# Patient Record
Sex: Female | Born: 1980 | Race: White | Hispanic: No | Marital: Married | Smoking: Never smoker
Health system: Southern US, Community
[De-identification: ages and names within clinical notes are randomized; demographics above are authoritative.]

## PROBLEM LIST (undated history)

## (undated) DIAGNOSIS — J45909 Unspecified asthma, uncomplicated: Secondary | ICD-10-CM

## (undated) DIAGNOSIS — E079 Disorder of thyroid, unspecified: Secondary | ICD-10-CM

## (undated) DIAGNOSIS — T7840XA Allergy, unspecified, initial encounter: Secondary | ICD-10-CM

## (undated) DIAGNOSIS — F419 Anxiety disorder, unspecified: Secondary | ICD-10-CM

## (undated) HISTORY — PX: BREAST ENHANCEMENT SURGERY: SHX7

## (undated) HISTORY — PX: CHOLECYSTECTOMY: SHX55

## (undated) HISTORY — DX: Allergy, unspecified, initial encounter: T78.40XA

## (undated) HISTORY — DX: Disorder of thyroid, unspecified: E07.9

## (undated) HISTORY — PX: WISDOM TOOTH EXTRACTION: SHX21

## (undated) HISTORY — DX: Anxiety disorder, unspecified: F41.9

---

## 2007-07-21 DIAGNOSIS — J45909 Unspecified asthma, uncomplicated: Secondary | ICD-10-CM | POA: Insufficient documentation

## 2007-08-11 ENCOUNTER — Ambulatory Visit: Payer: Self-pay | Admitting: Family Medicine

## 2007-09-22 ENCOUNTER — Ambulatory Visit: Payer: Self-pay | Admitting: Surgery

## 2007-10-03 ENCOUNTER — Ambulatory Visit: Payer: Self-pay | Admitting: Surgery

## 2014-09-12 ENCOUNTER — Emergency Department: Admit: 2014-09-12 | Disposition: A | Discharge status: Home / Self Care | Payer: Self-pay | Admitting: Student

## 2014-09-13 LAB — RESULTS CONSOLE HPV: CHL HPV: NEGATIVE

## 2014-09-13 LAB — HM PAP SMEAR: HM Pap smear: NEGATIVE

## 2016-10-01 DIAGNOSIS — J4530 Mild persistent asthma, uncomplicated: Secondary | ICD-10-CM | POA: Insufficient documentation

## 2017-01-07 ENCOUNTER — Encounter: Payer: Self-pay | Admitting: Maternal Newborn

## 2017-01-07 ENCOUNTER — Ambulatory Visit (INDEPENDENT_AMBULATORY_CARE_PROVIDER_SITE_OTHER): Payer: Self-pay | Admitting: Maternal Newborn

## 2017-01-07 VITALS — BP 120/80 | HR 84 | Ht 65.0 in | Wt 166.0 lb

## 2017-01-07 DIAGNOSIS — R300 Dysuria: Secondary | ICD-10-CM

## 2017-01-07 DIAGNOSIS — N390 Urinary tract infection, site not specified: Secondary | ICD-10-CM

## 2017-01-07 LAB — POCT URINALYSIS DIPSTICK
BILIRUBIN UA: NEGATIVE
Blood, UA: NEGATIVE
GLUCOSE UA: NEGATIVE
Ketones, UA: NEGATIVE
NITRITE UA: NEGATIVE
PH UA: 5.5 (ref 5.0–8.0)
Protein, UA: NEGATIVE
Spec Grav, UA: 1.02 (ref 1.010–1.025)
Urobilinogen, UA: 2 E.U./dL — AB

## 2017-01-07 MED ORDER — SULFAMETHOXAZOLE-TRIMETHOPRIM 800-160 MG PO TABS: 1.0000 | ORAL_TABLET | Freq: Two times a day (BID) | ORAL | 1 refills | Status: DC

## 2017-01-07 NOTE — Addendum Note (Signed)
Addended by: Marcelyn Bruins on: 01/07/2017 04:08 PM   Modules accepted: Orders

## 2017-01-07 NOTE — Patient Instructions (Signed)

## 2017-01-07 NOTE — Progress Notes (Signed)
     Obstetrics & Gynecology Office Visit   Chief Complaint: Dysuria, frequent urination Chief Complaint  Patient presents with  . Urinary Tract Infection    History of Present Illness: Ms. KENDALYNN KANAAN is a 36 y.o. [phone] who LMP was No LMP recorded., presents today for a problem visit.  She complains of dysuria, frequency and incomplete bladder emptying . She has had symptoms for 5 days. Patient also complains of urinary urgency. Symptoms are moderate.  Patient denies fever and chills, and flank pain. Patient does not have a history of recurrent UTI,  does not have a history of pyelonephritis, does not have a history of nephrolithiasis.  She has not had previous treatment for her current symptoms.    Review of Systems: Genito-Urinary ROS: positive for - change in urinary stream, dysuria and urinary frequency/urgency Negative for hematuria.  All other systems negative.  Past Medical History:  History reviewed. No pertinent past medical history.  Past Surgical History:  History reviewed. No pertinent surgical history.  Gynecologic History: No LMP recorded.  Obstetric History: [id number]  Family History:  Family History  Problem Relation Age of Onset  . Diabetes Mother   . Hypertension Father     Social History:  Social History   Social History  . Marital status: Married    Spouse name: N/A  . Number of children: N/A  . Years of education: N/A   Occupational History  . Not on file.   Social History Main Topics  . Smoking status: Never Smoker  . Smokeless tobacco: Never Used  . Alcohol use Yes  . Drug use: No  . Sexual activity: Yes    Birth control/ protection: IUD   Other Topics Concern  . Not on file   Social History Narrative  . No narrative on file    Allergies:  Allergies  Allergen Reactions  . Shellfish-Derived Products Anaphylaxis  . Shellfish Allergy     Medications: Prior to Admission medications   Medication Sig Start Date End Date Taking?  Authorizing Provider  sulfamethoxazole-trimethoprim (BACTRIM DS,SEPTRA DS) 800-160 MG tablet Take 1 tablet by mouth 2 (two) times daily. 01/07/17   Oswaldo Conroy, CNM    Physical Exam Vitals:  Vitals:   01/07/17 1051  BP: 120/80  Pulse: 84   No LMP recorded.  General: NAD Neurologic: Grossly intact Psychiatric: mood appropriate, affect full   Assessment: 36 y.o. G2P2002 presents with urinary frequency, urgency, and dysuria. Denies fever, chills, flank pain. UA positive for leukocytes.  Plan: Problem List Items Addressed This Visit    None    Visit Diagnoses    Dysuria    -  Primary   Relevant Medications   sulfamethoxazole-trimethoprim (BACTRIM DS,SEPTRA DS) 800-160 MG tablet   Other Relevant Orders   POCT urinalysis dipstick (Completed)     Patient to take Bactrim for seven day course. Urine cultures sent and will advise patient if results necessitate a change in antibiotic therapy.  Patient asked to return to clinic if symptoms fail to improve with abx or worsen.  Marcelyn Bruins, CNM

## 2017-01-09 LAB — URINE CULTURE: ORGANISM ID, BACTERIA: NO GROWTH

## 2018-04-16 ENCOUNTER — Encounter: Payer: Self-pay | Admitting: Emergency Medicine

## 2018-04-16 ENCOUNTER — Emergency Department: Payer: Self-pay

## 2018-04-16 ENCOUNTER — Other Ambulatory Visit: Payer: Self-pay

## 2018-04-16 ENCOUNTER — Emergency Department
Admission: EM | Admit: 2018-04-16 | Discharge: 2018-04-16 | Disposition: A | Discharge status: Home / Self Care | Payer: Self-pay | Attending: Student in an Organized Health Care Education/Training Program | Admitting: Student in an Organized Health Care Education/Training Program

## 2018-04-16 DIAGNOSIS — Y92410 Unspecified street and highway as the place of occurrence of the external cause: Secondary | ICD-10-CM | POA: Insufficient documentation

## 2018-04-16 DIAGNOSIS — Y999 Unspecified external cause status: Secondary | ICD-10-CM | POA: Insufficient documentation

## 2018-04-16 DIAGNOSIS — Y9389 Activity, other specified: Secondary | ICD-10-CM | POA: Insufficient documentation

## 2018-04-16 DIAGNOSIS — M5489 Other dorsalgia: Secondary | ICD-10-CM | POA: Insufficient documentation

## 2018-04-16 DIAGNOSIS — J45909 Unspecified asthma, uncomplicated: Secondary | ICD-10-CM | POA: Insufficient documentation

## 2018-04-16 DIAGNOSIS — Z79899 Other long term (current) drug therapy: Secondary | ICD-10-CM | POA: Insufficient documentation

## 2018-04-16 DIAGNOSIS — R079 Chest pain, unspecified: Secondary | ICD-10-CM | POA: Insufficient documentation

## 2018-04-16 HISTORY — DX: Unspecified asthma, uncomplicated: J45.909

## 2018-04-16 LAB — TROPONIN I: Troponin I: <0.03 ng/mL (ref <0.03)

## 2018-04-16 LAB — POCT PREGNANCY, URINE: Preg Test, Ur: NEGATIVE

## 2018-04-16 MED ORDER — IBUPROFEN 600 MG PO TABS: 600.0000 mg | ORAL_TABLET | Freq: Four times a day (QID) | ORAL | 0 refills | Status: DC | PRN

## 2018-04-16 MED ORDER — CYCLOBENZAPRINE HCL 5 MG PO TABS: ORAL_TABLET | ORAL | 0 refills | Status: DC
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
## 2018-05-17 HISTORY — PX: ROTATOR CUFF REPAIR: SHX139

## 2018-06-09 ENCOUNTER — Other Ambulatory Visit: Payer: Self-pay

## 2018-06-09 ENCOUNTER — Other Ambulatory Visit: Payer: Self-pay | Admitting: Unknown Physician Specialty

## 2018-06-14 ENCOUNTER — Other Ambulatory Visit (HOSPITAL_COMMUNITY): Payer: Self-pay | Admitting: Unknown Physician Specialty

## 2018-06-14 ENCOUNTER — Other Ambulatory Visit: Payer: Self-pay | Admitting: Unknown Physician Specialty

## 2018-06-14 DIAGNOSIS — G8929 Other chronic pain: Secondary | ICD-10-CM

## 2018-06-14 DIAGNOSIS — S8001XA Contusion of right knee, initial encounter: Secondary | ICD-10-CM

## 2018-06-14 DIAGNOSIS — M25512 Pain in left shoulder: Principal | ICD-10-CM

## 2019-05-30 ENCOUNTER — Other Ambulatory Visit: Payer: Self-pay

## 2019-05-30 ENCOUNTER — Encounter: Payer: Self-pay | Admitting: Obstetrics and Gynecology

## 2019-05-30 ENCOUNTER — Ambulatory Visit (INDEPENDENT_AMBULATORY_CARE_PROVIDER_SITE_OTHER): Payer: Managed Care, Other (non HMO) | Admitting: Obstetrics and Gynecology

## 2019-05-30 VITALS — BP 128/77 | HR 94 | Ht 64.0 in | Wt 162.6 lb

## 2019-05-30 DIAGNOSIS — Z30432 Encounter for removal of intrauterine contraceptive device: Secondary | ICD-10-CM

## 2019-05-30 NOTE — Progress Notes (Signed)
     GYNECOLOGY OFFICE PROCEDURE NOTE  Dawn Mejia is a 39 y.o. [phone] here for Mirena IUD removal. No GYN concerns. Notes that she and her partner are considering conception. IUD is due for removal this year, was insertedin 2016.   IUD Removal  Patient identified, informed consent performed, consent signed.  Patient was in the dorsal lithotomy position, normal external genitalia was noted.  A speculum was placed in the patient's vagina, normal discharge was noted, no lesions. The cervix was visualized, no lesions, no abnormal discharge.  The strings of the IUD were grasped and pulled using ring forceps. The IUD was removed in its entirety. Patient tolerated the procedure well.    Patient plans for pregnancy soon and she was told to avoid teratogens, take PNV and folic acid.  Routine preventative health maintenance measures emphasized.   Hildred Laser, MD [facility]

## 2019-05-30 NOTE — Patient Instructions (Signed)
Preparing for Pregnancy °If you are considering becoming pregnant, make an appointment to see your regular health care provider to learn how to prepare for a safe and healthy pregnancy (preconception care). During a preconception care visit, your health care provider will: °· Do a complete physical exam, including a Pap test. °· Take a complete medical history. °· Give you information, answer your questions, and help you resolve problems. °Preconception checklist °Medical history °· Tell your health care provider about any current or past medical conditions. Your pregnancy or your ability to become pregnant may be affected by chronic conditions, such as diabetes, chronic hypertension, and thyroid problems. °· Include your family's medical history as well as your partner's medical history. °· Tell your health care provider about any history of STIs (sexually transmitted infections). These can affect your pregnancy. In some cases, they can be passed to your baby. Discuss any concerns that you have about STIs. °· If indicated, discuss the benefits of genetic testing. This testing will show whether there are any genetic conditions that may be passed from you or your partner to your baby. °· Tell your health care provider about: °? Any problems you have had with conception or pregnancy. °? Any medicines you take. These include vitamins, herbal supplements, and over-the-counter medicines. °? Your history of immunizations. Discuss any vaccinations that you may need. °Diet °· Ask your health care provider what to include in a healthy diet that has a balance of nutrients. This is especially important when you are pregnant or preparing to become pregnant. °· Ask your health care provider to help you reach a healthy weight before pregnancy. °? If you are overweight, you may be at higher risk for certain complications, such as high blood pressure, diabetes, and preterm birth. °? If you are underweight, you are more likely to  have a baby who has a low birth weight. °Lifestyle, work, and home °· Let your health care provider know: °? About any lifestyle habits that you have, such as alcohol use, drug use, or smoking. °? About recreational activities that may put you at risk during pregnancy, such as downhill skiing and certain exercise programs. °? Tell your health care provider about any international travel, especially any travel to places with an active Zika virus outbreak. °? About harmful substances that you may be exposed to at work or at home. These include chemicals, pesticides, radiation, or even litter boxes. °? If you do not feel safe at home. °Mental health °· Tell your health care provider about: °? Any history of mental health conditions, including feelings of depression, sadness, or anxiety. °? Any medicines that you take for a mental health condition. These include herbs and supplements. °Home instructions to prepare for pregnancy °Lifestyle ° °· Eat a balanced diet. This includes fresh fruits and vegetables, whole grains, lean meats, low-fat dairy products, healthy fats, and foods that are high in fiber. Ask to meet with a nutritionist or registered dietitian for assistance with meal planning and goals. °· Get regular exercise. Try to be active for at least 30 minutes a day on most days of the week. Ask your health care provider which activities are safe during pregnancy. °· Do not use any products that contain nicotine or tobacco, such as cigarettes and e-cigarettes. If you need help quitting, ask your health care provider. °· Do not drink alcohol. °· Do not take illegal drugs. °· Maintain a healthy weight. Ask your health care provider what weight range is right for you. °General   instructions °· Keep an accurate record of your menstrual periods. This makes it easier for your health care provider to determine your baby's due date. °· Begin taking prenatal vitamins and folic acid supplements daily as directed by your  health care provider. °· Manage any chronic conditions, such as high blood pressure and diabetes, as told by your health care provider. This is important. °How do I know that I am pregnant? °You may be pregnant if you have been sexually active and you miss your period. Symptoms of early pregnancy include: °· Mild cramping. °· Very light vaginal bleeding (spotting). °· Feeling unusually tired. °· Nausea and vomiting (morning sickness). °If you have any of these symptoms and you suspect that you might be pregnant, you can take a home pregnancy test. These tests check for a hormone in your urine (human chorionic gonadotropin, or hCG). A woman's body begins to make this hormone during early pregnancy. These tests are very accurate. Wait until at least the first day after you miss your period to take one. If the test shows that you are pregnant (you get a positive result), call your health care provider to make an appointment for prenatal care. °What should I do if I become pregnant? ° °  ° °· Make an appointment with your health care provider as soon as you suspect you are pregnant. °· Do not use any products that contain nicotine, such as cigarettes, chewing tobacco, and e-cigarettes. If you need help quitting, ask your health care provider. °· Do not drink alcoholic beverages. Alcohol is related to a number of birth defects. °· Avoid toxic odors and chemicals. °· You may continue to have sexual intercourse if it does not cause pain or other problems, such as vaginal bleeding. °This information is not intended to replace advice given to you by your health care provider. Make sure you discuss any questions you have with your health care provider. °Document Revised: 05/05/2017 Document Reviewed: 11/23/2015 °Elsevier Patient Education © 2020 [employer] ° °

## 2019-05-30 NOTE — Progress Notes (Signed)
Pt present for IUD removal. Pt stated that she wants to have another child.

## 2019-06-07 ENCOUNTER — Encounter: Payer: Self-pay | Admitting: Family Medicine

## 2019-06-07 NOTE — Progress Notes (Signed)
Patient: Dawn Mejia, Female    DOB: 1980-11-19, 39 y.o.   MRN: [id number] Visit Date: 06/07/2019  Today's Provider: [name], MD   Chief Complaint  Patient presents with  . New Patient (Initial Visit)   Subjective:    Virtual Visit via Telephone Note  I connected with Dawn Mejia on 06/07/19 at  8:00 AM EST by telephone and verified that I am speaking with the correct person using two identifiers.  Location: Patient location: home Provider location: [facility] Persons involved in the visit: patient, provider   I discussed the limitations, risks, security and privacy concerns of performing an evaluation and management service by telephone and the availability of in person appointments. I also discussed with the patient that there may be a patient responsible charge related to this service. The patient expressed understanding and agreed to proceed.    Establish Care LERA GAINES is a 39 y.o. female who presents today for health maintenance and complete physical. She feels well. She reports exercising daily. She reports she is sleeping poorly.  Patient reports worsening insomnia in the last several months. Trouble falling asleep.  Patient reports she takes multiple Advil PM, Benadryl, and Unisom every night to sleep. Patient reports that in the past she has taken Trazodone (prescribed by Dr Tamala Julian at [facility] in the past), and reports good tolerance and symptom control. She has also tried Ambien in the past and noticed that she was doing things in her sleep that she did not remember.  No recent PCP  Asthma: Sees Dr Raul Del at [facility].  Takes albuterol prn.  Next appointment in March. No hospitalizations. No controller  Anxiety comes and goes with the stresses of life.  Her teen daughters test her patience at times.  Some days, notices that hands are shaking.  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  Much better than it was when she with her ex-husband.  Not  taking anything for it.  Allergic rhinitis: Year-round.  Allergic to grass, tress, pollen.  Takes Zyrtec or Claritin.  IUD removed earlier this month.  Considering pregnancy, as her husband does not have any children yet.  She is followed by Dr Marcelline Mates with [facility].  She has well-woman appt in April. -----------------------------------------------------------------   Review of Systems  Constitutional: Negative.   HENT: Negative.   Eyes: Negative.   Respiratory: Negative.   Cardiovascular: Negative.   Gastrointestinal: Negative.   Endocrine: Negative.   Genitourinary: Negative.   Musculoskeletal: Negative.   Skin: Negative.   Allergic/Immunologic: Positive for environmental allergies.  Neurological: Negative.   Hematological: Negative.   Psychiatric/Behavioral: Positive for sleep disturbance.    Social History      She  reports that she has never smoked. She has never used smokeless tobacco. She reports current alcohol use of about 2.0 standard drinks of alcohol per week. She reports that she does not use drugs.       Social History   Socioeconomic History  . Marital status: Married    Spouse name: Not on file  . Number of children: Not on file  . Years of education: Not on file  . Highest education level: Not on file  Occupational History  . Not on file  Tobacco Use  . Smoking status: Never Smoker  . Smokeless tobacco: Never Used  Substance and Sexual Activity  . Alcohol use: Yes    Alcohol/week: 2.0 standard drinks    Types: 2 Glasses of  wine per week  . Drug use: No  . Sexual activity: Yes  Other Topics Concern  . Not on file  Social History Narrative  . Not on file   Social Determinants of Health   Financial Resource Strain:   . Difficulty of Paying Living Expenses: Not on file  Food Insecurity:   . Worried About Programme researcher, broadcasting/film/video in the Last Year: Not on file  . Ran Out of Food in the Last Year: Not on file  Transportation Needs:   . Lack of  Transportation (Medical): Not on file  . Lack of Transportation (Non-Medical): Not on file  Physical Activity:   . Days of Exercise per Week: Not on file  . Minutes of Exercise per Session: Not on file  Stress:   . Feeling of Stress : Not on file  Social Connections:   . Frequency of Communication with Friends and Family: Not on file  . Frequency of Social Gatherings with Friends and Family: Not on file  . Attends Religious Services: Not on file  . Active Member of Clubs or Organizations: Not on file  . Attends Banker Meetings: Not on file  . Marital Status: Not on file    Past Medical History:  Diagnosis Date  . Allergy   . Anxiety   . Asthma      Patient Active Problem List   Diagnosis Date Noted  . Asthma, stable, mild persistent 10/01/2016    Past Surgical History:  Procedure Laterality Date  . CHOLECYSTECTOMY    . SHOULDER SURGERY    . WISDOM TOOTH EXTRACTION      Family History        Family Status  Relation Name Status  . Mother  Alive  . Father  Deceased  . Sister  Alive  . Daughter  Alive  . Sister  Alive  . Daughter  Alive        Her family history includes Allergies in her sister; Arthritis in her mother; Asthma in her father and sister; COPD in her father; Diabetes in her mother; Hypertension in her father; Thyroid disease in her sister.      Allergies  Allergen Reactions  . Shellfish-Derived Products Anaphylaxis  . Shellfish Allergy   . Tramadol Itching    welts     Current Outpatient Medications:  .  albuterol (PROVENTIL HFA;VENTOLIN HFA) 108 (90 Base) MCG/ACT inhaler, Inhale into the lungs every 6 (six) hours as needed for wheezing or shortness of breath., Disp: , Rfl:  .  diphenhydrAMINE (BENADRYL ALLERGY) 25 mg capsule, Take 25 mg by mouth every 6 (six) hours as needed., Disp: , Rfl:  .  diphenhydrAMINE HCl, Sleep, (UNISOM SLEEPGELS) 50 MG CAPS, Take by mouth., Disp: , Rfl:  .  Ibuprofen-diphenhydrAMINE HCl (ADVIL PM)  200-25 MG CAPS, Take by mouth., Disp: , Rfl:    Patient Care Team: Patient, No Pcp Per as PCP - General (General Practice)    Objective:    Vitals: Ht 5\' 4"  (1.626 m)   Wt 158 lb (71.7 kg)   BMI 27.12 kg/m    Vitals:   06/07/19 1047  Weight: 158 lb (71.7 kg)  Height: 5\' 4"  (1.626 m)     Physical Exam Speaking in full sentences, no apparent distress   Depression Screen PHQ 2/9 Scores 06/07/2019  PHQ - 2 Score 0  PHQ- 9 Score 3       Assessment & Plan:   I discussed the assessment and treatment  plan with the patient. The patient was provided an opportunity to ask questions and all were answered. The patient agreed with the plan and demonstrated an understanding of the instructions.   The patient was advised to call back or seek an in-person evaluation if the symptoms worsen or if the condition fails to improve as anticipated.    Establish care  Exercise Activities and Dietary recommendations Goals   None      There is no immunization history on file for this patient.  Health Maintenance  Topic Date Due  . HIV Screening  12/31/1995  . TETANUS/TDAP  12/31/1999  . PAP SMEAR-Modifier  12/30/2001  . INFLUENZA VACCINE  12/16/2018     Discussed health benefits of physical activity, and encouraged her to engage in regular exercise appropriate for her age and condition.    --------------------------------------------------------------------  Problem List Items Addressed This Visit      Respiratory   Asthma, stable, mild persistent    Chronic and well-controlled No controller medication Continue albuterol as needed Followed by Dr. Meredeth Ide, pulmonology      Allergic rhinitis    Chronic and well-controlled Continue Zyrtec/Claritin as needed        Other   Insomnia - Primary    Chronic and uncontrolled Difficulty falling asleep primarily Tried Ambien in the past and had significant issues with going places in talking to people in doing things that she  did not remember Would avoid this class of medications if possible She has been taking a lot of diphenhydramine to help with sleep initiation We will stop that and start trazodone 50 mg nightly as this has been helpful in the past She will follow-up with me and let me know if she may need a higher dose      Anxiety    Chronic, intermittent, mild Continue stress relief tactics No medication needed at this time Consider CBT in the future      Relevant Medications   traZODone (DESYREL) 50 MG tablet       Return in about 6 months (around 12/06/2019) for CPE.   The entirety of the information documented in the History of Present Illness, Review of Systems and Physical Exam were personally obtained by me. Portions of this information were initially documented by Rondel Baton, CMA and reviewed by me for thoroughness and accuracy.    Maryanne Huneycutt, Marzella Schlein, MD MPH [facility]

## 2019-06-08 ENCOUNTER — Encounter: Payer: Self-pay | Admitting: Family Medicine

## 2019-06-08 ENCOUNTER — Ambulatory Visit (INDEPENDENT_AMBULATORY_CARE_PROVIDER_SITE_OTHER): Payer: Managed Care, Other (non HMO) | Admitting: Family Medicine

## 2019-06-08 VITALS — Ht 64.0 in | Wt 158.0 lb

## 2019-06-08 DIAGNOSIS — J301 Allergic rhinitis due to pollen: Secondary | ICD-10-CM | POA: Diagnosis not present

## 2019-06-08 DIAGNOSIS — J4530 Mild persistent asthma, uncomplicated: Secondary | ICD-10-CM | POA: Diagnosis not present

## 2019-06-08 DIAGNOSIS — G4700 Insomnia, unspecified: Secondary | ICD-10-CM | POA: Diagnosis not present

## 2019-06-08 DIAGNOSIS — F419 Anxiety disorder, unspecified: Secondary | ICD-10-CM | POA: Diagnosis not present

## 2019-06-08 DIAGNOSIS — J309 Allergic rhinitis, unspecified: Secondary | ICD-10-CM | POA: Insufficient documentation

## 2019-06-08 MED ORDER — TRAZODONE HCL 50 MG PO TABS: 50.0000 mg | ORAL_TABLET | Freq: Every evening | ORAL | 3 refills | Status: DC | PRN

## 2019-06-08 NOTE — Patient Instructions (Signed)

## 2019-06-08 NOTE — Assessment & Plan Note (Signed)
Chronic, intermittent, mild Continue stress relief tactics No medication needed at this time Consider CBT in the future

## 2019-06-08 NOTE — Assessment & Plan Note (Signed)
Chronic and uncontrolled Difficulty falling asleep primarily Tried Ambien in the past and had significant issues with going places in talking to people in doing things that she did not remember Would avoid this class of medications if possible She has been taking a lot of diphenhydramine to help with sleep initiation We will stop that and start trazodone 50 mg nightly as this has been helpful in the past She will follow-up with me and let me know if she may need a higher dose

## 2019-06-08 NOTE — Assessment & Plan Note (Signed)
Chronic and well-controlled Continue Zyrtec/Claritin as needed

## 2019-06-08 NOTE — Assessment & Plan Note (Signed)
Chronic and well-controlled No controller medication Continue albuterol as needed Followed by Dr. Meredeth Ide, pulmonology

## 2019-06-16 ENCOUNTER — Encounter: Payer: Self-pay | Admitting: Family Medicine

## 2019-06-18 MED ORDER — TRAZODONE HCL 150 MG PO TABS: 150.0000 mg | ORAL_TABLET | Freq: Every evening | ORAL | 3 refills | Status: DC | PRN

## 2019-06-21 ENCOUNTER — Encounter: Payer: Self-pay | Admitting: Family Medicine

## 2019-06-22 NOTE — Telephone Encounter (Signed)
OK to move up on the schedule.  Needs to be within 2 weeks of discharge Needs 40 min

## 2019-09-11 NOTE — Progress Notes (Signed)
Pt present for annual exam. Pt stated that she was doing well. Pt and his is trying to conceive and have another baby. Pt denies any issues at this time.

## 2019-09-11 NOTE — Patient Instructions (Addendum)
Preventive Care 21-39 Years Old, Female Preventive care refers to visits with your health care provider and lifestyle choices that can promote health and wellness. This includes:  A yearly physical exam. This may also be called an annual well check.  Regular dental visits and eye exams.  Immunizations.  Screening for certain conditions.  Healthy lifestyle choices, such as eating a healthy diet, getting regular exercise, not using drugs or products that contain nicotine and tobacco, and limiting alcohol use. What can I expect for my preventive care visit? Physical exam Your health care provider will check your:  Height and weight. This may be used to calculate body mass index (BMI), which tells if you are at a healthy weight.  Heart rate and blood pressure.  Skin for abnormal spots. Counseling Your health care provider may ask you questions about your:  Alcohol, tobacco, and drug use.  Emotional well-being.  Home and relationship well-being.  Sexual activity.  Eating habits.  Work and work environment.  Method of birth control.  Menstrual cycle.  Pregnancy history. What immunizations do I need?  Influenza (flu) vaccine  This is recommended every year. Tetanus, diphtheria, and pertussis (Tdap) vaccine  You may need a Td booster every 10 years. Varicella (chickenpox) vaccine  You may need this if you have not been vaccinated. Human papillomavirus (HPV) vaccine  If recommended by your health care provider, you may need three doses over 6 months. Measles, mumps, and rubella (MMR) vaccine  You may need at least one dose of MMR. You may also need a second dose. Meningococcal conjugate (MenACWY) vaccine  One dose is recommended if you are age 19-21 years and a first-year college student living in a residence hall, or if you have one of several medical conditions. You may also need additional booster doses. Pneumococcal conjugate (PCV13) vaccine  You may need  this if you have certain conditions and were not previously vaccinated. Pneumococcal polysaccharide (PPSV23) vaccine  You may need one or two doses if you smoke cigarettes or if you have certain conditions. Hepatitis A vaccine  You may need this if you have certain conditions or if you travel or work in places where you may be exposed to hepatitis A. Hepatitis B vaccine  You may need this if you have certain conditions or if you travel or work in places where you may be exposed to hepatitis B. Haemophilus influenzae type b (Hib) vaccine  You may need this if you have certain conditions. You may receive vaccines as individual doses or as more than one vaccine together in one shot (combination vaccines). Talk with your health care provider about the risks and benefits of combination vaccines. What tests do I need?  Blood tests  Lipid and cholesterol levels. These may be checked every 5 years starting at age 20.  Hepatitis C test.  Hepatitis B test. Screening  Diabetes screening. This is done by checking your blood sugar (glucose) after you have not eaten for a while (fasting).  Sexually transmitted disease (STD) testing.  BRCA-related cancer screening. This may be done if you have a family history of breast, ovarian, tubal, or peritoneal cancers.  Pelvic exam and Pap test. This may be done every 3 years starting at age 21. Starting at age 30, this may be done every 5 years if you have a Pap test in combination with an HPV test. Talk with your health care provider about your test results, treatment options, and if necessary, the need for more tests.   Follow these instructions at home: Eating and drinking   Eat a diet that includes fresh fruits and vegetables, whole grains, lean protein, and low-fat dairy.  Take vitamin and mineral supplements as recommended by your health care provider.  Do not drink alcohol if: ? Your health care provider tells you not to drink. ? You are  pregnant, may be pregnant, or are planning to become pregnant.  If you drink alcohol: ? Limit how much you have to 0-1 drink a day. ? Be aware of how much alcohol is in your drink. In the [place] one drink equals one 12 oz bottle of beer (355 mL), one 5 oz glass of wine (148 mL), or one 1 oz glass of hard liquor (44 mL). Lifestyle  Take daily care of your teeth and gums.  Stay active. Exercise for at least 30 minutes on 5 or more days each week.  Do not use any products that contain nicotine or tobacco, such as cigarettes, e-cigarettes, and chewing tobacco. If you need help quitting, ask your health care provider.  If you are sexually active, practice safe sex. Use a condom or other form of birth control (contraception) in order to prevent pregnancy and STIs (sexually transmitted infections). If you plan to become pregnant, see your health care provider for a preconception visit. What's next?  Visit your health care provider once a year for a well check visit.  Ask your health care provider how often you should have your eyes and teeth checked.  Stay up to date on all vaccines. This information is not intended to replace advice given to you by your health care provider. Make sure you discuss any questions you have with your health care provider. Document Revised: 01/12/2018 Document Reviewed: 01/12/2018 Elsevier Patient Education  2020 [employer] Breast Self-Awareness Breast self-awareness is knowing how your breasts look and feel. Doing breast self-awareness is important. It allows you to catch a breast problem early while it is still small and can be treated. All women should do breast self-awareness, including women who have had breast implants. Tell your doctor if you notice a change in your breasts. What you need:  A mirror.  A well-lit room. How to do a breast self-exam A breast self-exam is one way to learn what is normal for your breasts and to check for changes. To do a  breast self-exam: Look for changes  1. Take off all the clothes above your waist. 2. Stand in front of a mirror in a room with good lighting. 3. Put your hands on your hips. 4. Push your hands down. 5. Look at your breasts and nipples in the mirror to see if one breast or nipple looks different from the other. Check to see if: ? The shape of one breast is different. ? The size of one breast is different. ? There are wrinkles, dips, and bumps in one breast and not the other. 6. Look at each breast for changes in the skin, such as: ? Redness. ? Scaly areas. 7. Look for changes in your nipples, such as: ? Liquid around the nipples. ? Bleeding. ? Dimpling. ? Redness. ? A change in where the nipples are. Feel for changes  1. Lie on your back on the floor. 2. Feel each breast. To do this, follow these steps: ? Pick a breast to feel. ? Put the arm closest to that breast above your head. ? Use your other arm to feel the nipple area of your breast. Feel   breast. Feel the area with the pads of your three middle fingers by making small circles with your fingers. For the first circle, press lightly. For the second circle, press harder. For the third circle, press even harder. ? Keep making circles with your fingers at the different pressures as you move down your breast. Stop when you feel your ribs. ? Move your fingers a little toward the center of your body. ? Start making circles with your fingers again, this time going up until you reach your collarbone. ? Keep making up-and-down circles until you reach your armpit. Remember to keep using the three pressures. ? Feel the other breast in the same way. 3. Sit or stand in the tub or shower. 4. With soapy water on your skin, feel each breast the same way you did in step 2 when you were lying on the floor. Write down what you find Writing down what you find can help you remember what to tell your doctor. Write down:  What is normal for each breast.  Any  changes you find in each breast, including: ? The kind of changes you find. ? Whether you have pain. ? Size and location of any lumps.  When you last had your menstrual period. General tips  Check your breasts every month.  If you are breastfeeding, the best time to check your breasts is after you feed your baby or after you use a breast pump.  If you get menstrual periods, the best time to check your breasts is 5-7 days after your menstrual period is over.  With time, you will become comfortable with the self-exam, and you will begin to know if there are changes in your breasts. Contact a doctor if you:  See a change in the shape or size of your breasts or nipples.  See a change in the skin of your breast or nipples, such as red or scaly skin.  Have fluid coming from your nipples that is not normal.  Find a lump or thick area that was not there before.  Have pain in your breasts.  Have any concerns about your breast health. Summary  Breast self-awareness includes looking for changes in your breasts, as well as feeling for changes within your breasts.  Breast self-awareness should be done in front of a mirror in a well-lit room.  You should check your breasts every month. If you get menstrual periods, the best time to check your breasts is 5-7 days after your menstrual period is over.  Let your doctor know of any changes you see in your breasts, including changes in size, changes on the skin, pain or tenderness, or fluid from your nipples that is not normal. This information is not intended to replace advice given to you by your health care provider. Make sure you discuss any questions you have with your health care provider. Document Revised: 12/20/2017 Document Reviewed: 12/20/2017 Elsevier Patient Education  [place].

## 2019-09-12 ENCOUNTER — Other Ambulatory Visit: Payer: Self-pay

## 2019-09-12 ENCOUNTER — Ambulatory Visit (INDEPENDENT_AMBULATORY_CARE_PROVIDER_SITE_OTHER): Payer: 59 | Admitting: Obstetrics and Gynecology

## 2019-09-12 ENCOUNTER — Encounter: Payer: Self-pay | Admitting: Obstetrics and Gynecology

## 2019-09-12 VITALS — BP 107/67 | HR 87 | Ht 64.0 in | Wt 157.5 lb

## 2019-09-12 DIAGNOSIS — Z01419 Encounter for gynecological examination (general) (routine) without abnormal findings: Secondary | ICD-10-CM

## 2019-09-12 DIAGNOSIS — E663 Overweight: Secondary | ICD-10-CM

## 2019-09-12 NOTE — Progress Notes (Signed)
GYNECOLOGY ANNUAL PHYSICAL EXAM PROGRESS NOTE  Subjective:    Dawn Mejia is a 39 y.o. G45P2002 female who presents for an annual exam. The patient has no complaints today. The patient is sexually active. Her IUD was removed in January. She states she is currently not trying prevent conception, but isn't actively trying. The patient wears seatbelts: yes. The patient participates in regular exercise: not asked. Has the patient ever been transfused or tattooed?: not asked. The patient reports that there is not domestic violence in her life.   Gynecologic History Patient's last menstrual period was 08/23/2019.  Since IUD removal 05/2019 menses lasting 5-6 days with moderate flow. Reports changing her tampon 3-4x in 12 hours. Denies cramping with menstruation but reports ovulation pain.  Menarche age: 7-13 Contraception: none. Had IUD removed 05/2019.  History of STI's: Pt denies Last Pap: 2 years ago (peformed at [facility]). Results were: normal.  Denies h/o abnormal pap smears.   OB History  Gravida Para Term Preterm AB Living  2 2 2  0 0 2  SAB TAB Ectopic Multiple Live Births  0 0 0 0 0    # Outcome Date GA Lbr Len/2nd Weight Sex Delivery Anes PTL Lv  2 Term           1 Term             Past Medical History:  Diagnosis Date  . Allergy   . Anxiety   . Asthma     Past Surgical History:  Procedure Laterality Date  . BREAST ENHANCEMENT SURGERY    . CHOLECYSTECTOMY    . ROTATOR CUFF REPAIR Left 2020  . WISDOM TOOTH EXTRACTION      Family History  Problem Relation Age of Onset  . Diabetes Mother   . Arthritis Mother   . Hypertension Father   . Asthma Father   . COPD Father        smoker  . Asthma Sister   . Allergies Sister   . Thyroid disease Sister   . Asthma Daughter   . Allergic Disorder Daughter   . Healthy Sister   . Healthy Daughter   . Colon cancer Maternal Uncle 60  . Breast cancer Neg Hx     Social History   Socioeconomic History  . Marital  status: Married    Spouse name: Not on file  . Number of children: 2  . Years of education: Not on file  . Highest education level: Not on file  Occupational History  . Occupation:  Tobacco Use  . Smoking status: Never Smoker  . Smokeless tobacco: Never Used  Substance and Sexual Activity  . Alcohol use: Yes    Alcohol/week: 0.0 - 2.0 standard drinks    Comment: occasional  . Drug use: No  . Sexual activity: Yes    Partners: Male    Birth control/protection: None  Other Topics Concern  . Not on file  Social History Narrative  . Not on file   Social Determinants of Health   Financial Resource Strain:   . Difficulty of Paying Living Expenses:   Food Insecurity:   . Worried About Sales executive in the Last Year:   . Programme researcher, broadcasting/film/video in the Last Year:   Transportation Needs:   . Barista (Medical):   Freight forwarder Lack of Transportation (Non-Medical):   Physical Activity:   . Days of Exercise per Week:   . Minutes of Exercise per  Session:   Stress:   . Feeling of Stress :   Social Connections:   . Frequency of Communication with Friends and Family:   . Frequency of Social Gatherings with Friends and Family:   . Attends Religious Services:   . Active Member of Clubs or Organizations:   . Attends Archivist Meetings:   Marland Kitchen Marital Status:   Intimate Partner Violence:   . Fear of Current or Ex-Partner:   . Emotionally Abused:   Marland Kitchen Physically Abused:   . Sexually Abused:     Current Outpatient Medications on File Prior to Visit  Medication Sig Dispense Refill  . albuterol (PROVENTIL HFA;VENTOLIN HFA) 108 (90 Base) MCG/ACT inhaler Inhale into the lungs every 6 (six) hours as needed for wheezing or shortness of breath.    . montelukast (SINGULAIR) 10 MG tablet Take 10 mg by mouth at bedtime.    . traZODone (DESYREL) 150 MG tablet Take 1 tablet (150 mg total) by mouth at bedtime as needed for sleep. 30 tablet 3   No current  facility-administered medications on file prior to visit.    Allergies  Allergen Reactions  . Shellfish-Derived Products Anaphylaxis  . Shellfish Allergy   . Tramadol Itching    welts    Review of Systems Constitutional: negative for chills, fatigue, fevers and sweats Eyes: negative for irritation, redness and visual disturbance Ears, nose, mouth, throat, and face: negative for hearing loss, nasal congestion, snoring and tinnitus Respiratory: negative for cough, sputum Cardiovascular: negative for chest pain, dyspnea, exertional chest pressure/discomfort, irregular heart beat, palpitations and syncope Gastrointestinal: negative for abdominal pain, change in bowel habits, nausea and vomiting Genitourinary: negative for abnormal menstrual periods, genital lesions, sexual problems and vaginal discharge, dysuria and urinary incontinence Integument/breast: negative for breast lump, breast tenderness and nipple discharge Hematologic/lymphatic: negative for bleeding and easy bruising Musculoskeletal:negative for back pain and muscle weakness Neurological: negative for dizziness, headaches, vertigo and weakness Endocrine: negative for diabetic symptoms including polydipsia, polyuria and skin dryness Allergic/Immunologic: negative for hay fever and urticaria        Objective:  Blood pressure 107/67, pulse 87, height 5\' 4"  (1.626 m), weight 71.4 kg, last menstrual period 08/23/2019. Body mass index is 27.03 kg/m.  General Appearance:    Alert, cooperative, no distress, appears stated age, overweight  Head:    Normocephalic, without obvious abnormality, atraumatic  Eyes:    PERRL, conjunctiva/corneas clear, EOM's intact, both eyes  Ears:    Normal external ear canals, both ears  Nose:   Nares normal, septum midline, mucosa normal, no drainage or sinus tenderness  Throat:   Lips, mucosa, and tongue normal; teeth and gums normal  Neck:   Supple, symmetrical, trachea midline, no adenopathy;  thyroid: no enlargement/tenderness/nodules; no carotid bruit or JVD  Back:     Symmetric, no curvature, ROM normal, no CVA tenderness  Lungs:     Clear to auscultation bilaterally, respirations unlabored  Chest Wall:    No tenderness or deformity   Heart:    Regular rate and rhythm, S1 and S2 normal, no murmur, rub or gallop  Breast Exam:    No tenderness, masses, or nipple abnormality  Abdomen:     Soft, non-tender, bowel sounds active all four quadrants, no masses, no organomegaly.    Genitalia:    Pelvic:external genitalia normal, vagina without lesions, discharge, or tenderness, rectovaginal septum  normal. Cervix normal in appearance, no cervical motion tenderness, no adnexal masses or tenderness.  Uterus normal size, shape,  mobile, regular contours, nontender.  Rectal:    Normal external sphincter.  No hemorrhoids appreciated. Internal exam not done.   Extremities:   Extremities normal, atraumatic, no cyanosis or edema  Pulses:   2+ and symmetric all extremities  Skin:   Skin color, texture, turgor normal, no rashes or lesions  Lymph nodes:   Cervical, supraclavicular, and axillary nodes normal  Neurologic:   CNII-XII intact, normal strength, sensation and reflexes throughout   .  Labs:  No results found for: WBC, HGB, HCT, MCV, PLT  No results found for: CREATININE, BUN, NA, K, CL, CO2  No results found for: ALT, AST, GGT, ALKPHOS, BILITOT  No results found for: TSH   Assessment:   1. Encounter for well woman exam with routine gynecological exam   2. Overweight (BMI 25.0-29.9)     Plan:    Last pap 2 years ago - due in 1 year Contraception: none - patient is planning to try to conceive.  Blood tests: CBC with diff, Comprehensive metabolic panel, TSH and lipid panel. Breast self exam technique reviewed and patient encouraged to perform self-exam monthly. Follow up in 1 year. for annual exam.       Aris Lot, Student-PA, [employer]    I have seen and examined the  patient with Aris Lot, [employer] PA-S.  I have reviewed the record and concur with patient management and plan.   Hildred Laser, MD [facility]   [facility]

## 2019-09-13 LAB — CBC
Hematocrit: 38.9 % (ref 34.0–46.6)
Hemoglobin: 13.4 g/dL (ref 11.1–15.9)
MCH: 30.2 pg (ref 26.6–33.0)
MCHC: 34.4 g/dL (ref 31.5–35.7)
MCV: 88 fL (ref 79–97)
Platelets: 178 10*3/uL (ref 150–450)
RBC: 4.44 x10E6/uL (ref 3.77–5.28)
RDW: 11.5 % — ABNORMAL LOW (ref 11.7–15.4)
WBC: 6.3 10*3/uL (ref 3.4–10.8)

## 2019-09-13 LAB — COMPREHENSIVE METABOLIC PANEL WITH GFR
ALT: 41 [IU]/L — ABNORMAL HIGH (ref 0–32)
AST: 18 [IU]/L (ref 0–40)
Albumin/Globulin Ratio: 2.5 — ABNORMAL HIGH (ref 1.2–2.2)
Albumin: 4.7 g/dL (ref 3.8–4.8)
Alkaline Phosphatase: 86 [IU]/L (ref 39–117)
BUN/Creatinine Ratio: 12 (ref 9–23)
BUN: 10 mg/dL (ref 6–20)
Bilirubin Total: 0.5 mg/dL (ref 0.0–1.2)
CO2: 25 mmol/L (ref 20–29)
Calcium: 9.3 mg/dL (ref 8.7–10.2)
Chloride: 98 mmol/L (ref 96–106)
Creatinine, Ser: 0.85 mg/dL (ref 0.57–1.00)
GFR calc Af Amer: 101 mL/min/{1.73_m2}
GFR calc non Af Amer: 87 mL/min/{1.73_m2}
Globulin, Total: 1.9 g/dL (ref 1.5–4.5)
Glucose: 93 mg/dL (ref 65–99)
Potassium: 3.8 mmol/L (ref 3.5–5.2)
Sodium: 136 mmol/L (ref 134–144)
Total Protein: 6.6 g/dL (ref 6.0–8.5)

## 2019-09-13 LAB — LIPID PANEL
Chol/HDL Ratio: 3.4 ratio (ref 0.0–4.4)
Cholesterol, Total: 138 mg/dL (ref 100–199)
HDL: 41 mg/dL
LDL Chol Calc (NIH): 82 mg/dL (ref 0–99)
Triglycerides: 74 mg/dL (ref 0–149)
VLDL Cholesterol Cal: 15 mg/dL (ref 5–40)

## 2019-09-13 LAB — TSH: TSH: 2.96 u[IU]/mL (ref 0.450–4.500)

## 2019-11-16 ENCOUNTER — Telehealth (INDEPENDENT_AMBULATORY_CARE_PROVIDER_SITE_OTHER): Payer: 59 | Admitting: Family Medicine

## 2019-11-16 ENCOUNTER — Encounter: Payer: Self-pay | Admitting: Family Medicine

## 2019-11-16 DIAGNOSIS — J4521 Mild intermittent asthma with (acute) exacerbation: Secondary | ICD-10-CM

## 2019-11-16 DIAGNOSIS — J0110 Acute frontal sinusitis, unspecified: Secondary | ICD-10-CM | POA: Diagnosis not present

## 2019-11-16 MED ORDER — PREDNISONE 20 MG PO TABS: 20.0000 mg | ORAL_TABLET | Freq: Two times a day (BID) | ORAL | 0 refills | Status: AC

## 2019-11-16 MED ORDER — AZITHROMYCIN 250 MG PO TABS: ORAL_TABLET | ORAL | 0 refills | Status: AC

## 2019-11-16 NOTE — Progress Notes (Signed)
MyChart Video Visit    Virtual Visit via Video Note   This visit type was conducted due to national recommendations for restrictions regarding the COVID-19 Pandemic (e.g. social distancing) in an effort to limit this patient's exposure and mitigate transmission in our community. This patient is at least at moderate risk for complications without adequate follow up. This format is felt to be most appropriate for this patient at this time. Physical exam was limited by quality of the video and audio technology used for the visit.   Patient location: home Provider location: b[facility]   Patient: Dawn Mejia   DOB: [birth year]   39 y.o. Female  MRN: [id number] Visit Date: 11/16/2019  Today's healthcare provider: Mila Merry, MD   Chief Complaint  Patient presents with  . URI   I,Latasha Walston,acting as a scribe for Mila Merry, MD.,have documented all relevant documentation on the behalf of Mila Merry, MD,as directed by  Mila Merry, MD while in the presence of Mila Merry, MD.  Subjective    URI  This is a new problem. The current episode started in the past 7 days. The problem has been gradually worsening. There has been no fever. Associated symptoms include congestion, coughing, headaches, sinus pain and a sore throat. Treatments tried: Severe sinus and Mucionex. The treatment provided no relief.   Now moving down into chest with productive cough. No fevers or chills, or sweats. +Sore throat. Had Covid test this morning. Has not had vaccine. Has been having to use inhalers 3-4 times a day.     Medications: Outpatient Medications Prior to Visit  Medication Sig  . albuterol (PROVENTIL HFA;VENTOLIN HFA) 108 (90 Base) MCG/ACT inhaler Inhale into the lungs every 6 (six) hours as needed for wheezing or shortness of breath.  . montelukast (SINGULAIR) 10 MG tablet Take 10 mg by mouth at bedtime.  . traZODone (DESYREL) 150 MG tablet Take 1 tablet (150 mg total) by mouth at  bedtime as needed for sleep.   No facility-administered medications prior to visit.    Review of Systems  HENT: Positive for congestion, sinus pain and sore throat.   Respiratory: Positive for cough.   Neurological: Positive for headaches.     Objective    There were no vitals taken for this visit.  Physical Exam   Awake, alert, oriented x 3. In no apparent distress   Assessment & Plan     1. Mild intermittent asthma with exacerbation  - predniSONE (DELTASONE) 20 MG tablet; Take 1 tablet (20 mg total) by mouth 2 (two) times daily with a meal for 5 days.  Dispense: 10 tablet; Refill: 0  2. Acute non-recurrent frontal sinusitis  - azithromycin (ZITHROMAX) 250 MG tablet; 2 by mouth today, then 1 daily for 4 days  Dispense: 6 tablet; Refill: 0 - predniSONE (DELTASONE) 20 MG tablet; Take 1 tablet (20 mg total) by mouth 2 (two) times daily with a meal for 5 days.  Dispense: 10 tablet; Refill: 0   No follow-ups on file.     I discussed the assessment and treatment plan with the patient. The patient was provided an opportunity to ask questions and all were answered. The patient agreed with the plan and demonstrated an understanding of the instructions.   The patient was advised to call back or seek an in-person evaluation if the symptoms worsen or if the condition fails to improve as anticipated. Video connection was lost when less than 50% of the duration of the visit  was complete, at which time the remainder of the visit was completed via audio only.  I provided 8 minutes of non-face-to-face time during this encounter.  The entirety of the information documented in the History of Present Illness, Review of Systems and Physical Exam were personally obtained by me. Portions of this information were initially documented by the CMA and reviewed by me for thoroughness and accuracy.     Mila Merry, MD [facility] [phone] (phone) [phone] (fax)  [facility]

## 2019-11-16 NOTE — Telephone Encounter (Signed)
Called patient to reschedule her CPE appt w/ Dr. Leonard Schwartz.  Patient was asking about her message.  I checked with Dr. Sherrie Mustache.  He is doing a MyChart visit at 3 pm with her today.   She has had a covid test earlier today to determine if the symptoms are covid.  Thanks, [employer]

## 2019-12-13 ENCOUNTER — Encounter: Payer: Self-pay | Admitting: Family Medicine

## 2019-12-17 ENCOUNTER — Encounter: Payer: Self-pay | Admitting: Family Medicine

## 2019-12-17 ENCOUNTER — Ambulatory Visit: Payer: Self-pay

## 2019-12-18 MED ORDER — TRAZODONE HCL 150 MG PO TABS: 150.0000 mg | ORAL_TABLET | Freq: Every evening | ORAL | 5 refills | Status: DC | PRN

## 2020-01-16 ENCOUNTER — Encounter: Payer: Self-pay | Admitting: Family Medicine

## 2020-01-16 ENCOUNTER — Telehealth: Payer: Self-pay

## 2020-01-16 NOTE — Telephone Encounter (Signed)
Copied from CRM [id number]. Topic: Appointment Scheduling - Scheduling Inquiry for Clinic >> Jan 16, 2020  9:47 AM Leafy Ro wrote: Reason for CRM pt will sent mychart message to dr b. Pt would like to be seen today for stress issues. Pt would like an appt today

## 2020-01-16 NOTE — Telephone Encounter (Signed)
Apt made for 01/17/2020 at 10am.   Thanks,   -Vernona Rieger

## 2020-01-16 NOTE — Telephone Encounter (Signed)
Called pt. Apt made for 01/17/2020 at 10am.  Thanks,   -Vernona Rieger

## 2020-01-17 ENCOUNTER — Other Ambulatory Visit: Payer: Self-pay

## 2020-01-17 ENCOUNTER — Encounter: Payer: Self-pay | Admitting: Family Medicine

## 2020-01-17 ENCOUNTER — Ambulatory Visit (INDEPENDENT_AMBULATORY_CARE_PROVIDER_SITE_OTHER): Payer: 59 | Admitting: Family Medicine

## 2020-01-17 VITALS — BP 127/86 | HR 90 | Temp 98.7°F | Resp 16 | Ht 63.0 in | Wt 168.0 lb

## 2020-01-17 DIAGNOSIS — F4310 Post-traumatic stress disorder, unspecified: Secondary | ICD-10-CM

## 2020-01-17 DIAGNOSIS — F419 Anxiety disorder, unspecified: Secondary | ICD-10-CM

## 2020-01-17 MED ORDER — SERTRALINE HCL 50 MG PO TABS: 50.0000 mg | ORAL_TABLET | Freq: Every day | ORAL | 3 refills | Status: DC

## 2020-01-17 MED ORDER — HYDROXYZINE HCL 10 MG PO TABS: 10.0000 mg | ORAL_TABLET | Freq: Three times a day (TID) | ORAL | 0 refills | Status: DC | PRN
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
## 2020-02-28 ENCOUNTER — Encounter: Payer: Self-pay | Admitting: Family Medicine

## 2020-02-28 ENCOUNTER — Other Ambulatory Visit: Payer: Self-pay

## 2020-02-28 ENCOUNTER — Ambulatory Visit (INDEPENDENT_AMBULATORY_CARE_PROVIDER_SITE_OTHER): Payer: 59 | Admitting: Family Medicine

## 2020-02-28 VITALS — BP 115/72 | HR 72 | Temp 98.4°F | Wt 168.2 lb

## 2020-02-28 DIAGNOSIS — F4310 Post-traumatic stress disorder, unspecified: Secondary | ICD-10-CM | POA: Diagnosis not present

## 2020-02-28 DIAGNOSIS — F5104 Psychophysiologic insomnia: Secondary | ICD-10-CM | POA: Diagnosis not present

## 2020-02-28 DIAGNOSIS — F419 Anxiety disorder, unspecified: Secondary | ICD-10-CM | POA: Diagnosis not present

## 2020-02-28 MED ORDER — HYDROXYZINE HCL 10 MG PO TABS: 10.0000 mg | ORAL_TABLET | Freq: Three times a day (TID) | ORAL | 3 refills | Status: DC | PRN
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
## 2020-02-28 NOTE — Patient Instructions (Signed)
[email or web address]
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
## 2020-02-28 NOTE — Assessment & Plan Note (Signed)
Related to previous husband's drug use and now exacerbated by husband's drug use and finding him unresponsive at home Dreams are improving Continue hydroxyzine prn Continue zoloft as above Have encouraged therapy

## 2020-03-25 ENCOUNTER — Other Ambulatory Visit: Payer: Self-pay

## 2020-03-25 ENCOUNTER — Encounter: Payer: Self-pay | Admitting: Obstetrics and Gynecology

## 2020-03-25 ENCOUNTER — Ambulatory Visit (INDEPENDENT_AMBULATORY_CARE_PROVIDER_SITE_OTHER): Payer: 59 | Admitting: Obstetrics and Gynecology

## 2020-03-25 VITALS — BP 108/75 | HR 85 | Ht 63.0 in | Wt 175.1 lb

## 2020-03-25 DIAGNOSIS — N926 Irregular menstruation, unspecified: Secondary | ICD-10-CM | POA: Diagnosis not present

## 2020-03-25 DIAGNOSIS — N938 Other specified abnormal uterine and vaginal bleeding: Secondary | ICD-10-CM

## 2020-03-25 NOTE — Progress Notes (Signed)
Pt present for irregular cycles. Pt stated that her last cycle was irregular and had several irregular cycles. Pt stated her first cycle was 10/18-10/23 which was regular; 10/24-10/29 had spotting; 10/30-11/3 heavy bleeding changing pads every 2 hours. Pt stated that this is the first month since removing the IUD that she has had irregular cycles.

## 2020-03-25 NOTE — Progress Notes (Signed)
.     GYNECOLOGY PROGRESS NOTE  Subjective:    Patient ID: Dawn Mejia, female    DOB: [birth year], 39 y.o.   MRN: [id number]  HPI  Patient is a 39 y.o. G62P2002 female who presents for complaints of irregular menstrual bleeding.  She reports that her last cycle was irregular.  She notes her cycle was 10/18-10-23 which is regular, then had spotting for an additional five days, followed by resumption of heavy bleeding for an addition five days with passing clots. States during this time she was changing a pad every 2 hours. She denied pain/cramping.   She reports taking a pregnancy test last week which was negative. Her IUD was removed in January, as she and her husband had been contemplating pregnancy.   Of note, she reports a remote history of hysteroscopy for postcoital bleeding, was diagnosed with endometrial polyp.   Recent events surrounding this incident include receiving COVID vaccination in July and August, and initiation of Zoloft in early September.  She also reports being under a lot of stress recently.    The following portions of the patient's history were reviewed and updated as appropriate: allergies, current medications, past family history, past medical history, past social history, past surgical history and problem list.  Review of Systems Pertinent items noted in HPI and remainder of comprehensive ROS otherwise negative.   Objective:   Blood pressure 108/75, pulse 85, height 5\' 3"  (1.6 m), weight 175 lb 1.6 oz (79.4 kg), last menstrual period 03/03/2020. General appearance: alert and no distress Abdomen: soft, non-tender; bowel sounds normal; no masses,  no organomegaly Pelvic: external genitalia normal, rectovaginal septum normal.  Vagina without discharge.  Cervix normal appearing, no lesions and no motion tenderness.  Uterus mobile, nontender, normal shape and size.  Adnexae non-palpable, nontender bilaterally.  Extremities: extremities normal, atraumatic, no cyanosis  or edema Neurologic: Grossly normal   Assessment:   Abnormal menstrual cycle  Plan:   - Discussion had with patient regarding irregular cycle. Has had many events surrounding abnormal cycle, including recent COVID vaccination, initiation of a new medication, stressors.  Discussed option of expectant management at this time as it was a single occurrence.  If it becomes recurring, can consider further workup with ultrasound and labs.    Return to clinic for any scheduled appointments or for any gynecologic concerns as needed.    03/05/2020, MD [facility]

## 2020-03-25 NOTE — Patient Instructions (Signed)
Dysfunctional Uterine Bleeding Dysfunctional uterine bleeding is abnormal bleeding from the uterus. Dysfunctional uterine bleeding includes:  A menstrual period that comes earlier or later than usual.  A menstrual period that is lighter or heavier than usual, or has large blood clots.  Vaginal bleeding between menstrual periods.  Skipping one or more menstrual periods.  Vaginal bleeding after sex.  Vaginal bleeding after menopause. Follow these instructions at home: Eating and drinking   Eat well-balanced meals. Include foods that are high in iron, such as liver, meat, shellfish, green leafy vegetables, and eggs.  To prevent or treat constipation, your health care provider may recommend that you: ? Drink enough fluid to keep your urine pale yellow. ? Take over-the-counter or prescription medicines. ? Eat foods that are high in fiber, such as beans, whole grains, and fresh fruits and vegetables. ? Limit foods that are high in fat and processed sugars, such as fried or sweet foods. Medicines  Take over-the-counter and prescription medicines only as told by your health care provider.  Do not change medicines without talking with your health care provider.  Aspirin or medicines that contain aspirin may make the bleeding worse. Do not take those medicines: ? During the week before your menstrual period. ? During your menstrual period.  If you were prescribed iron pills, take them as told by your health care provider. Iron pills help to replace iron that your body loses because of this condition. Activity  If you need to change your sanitary pad or tampon more than one time every 2 hours: ? Lie in bed with your feet raised (elevated). ? Place a cold pack on your lower abdomen. ? Rest as much as possible until the bleeding stops or slows down.  Do not try to lose weight until the bleeding has stopped and your blood iron level is back to normal. General instructions   For two  months, write down: ? When your menstrual period starts. ? When your menstrual period ends. ? When any abnormal vaginal bleeding occurs. ? What problems you notice.  Keep all follow up visits as told by your health care provider. This is important. Contact a health care provider if you:  Feel light-headed or weak.  Have nausea and vomiting.  Cannot eat or drink without vomiting.  Feel dizzy or have diarrhea while you are taking medicines.  Are taking birth control pills or hormones, and you want to change them or stop taking them. Get help right away if:  You develop a fever or chills.  You need to change your sanitary pad or tampon more than one time per hour.  Your vaginal bleeding becomes heavier, or your flow contains clots more often.  You develop pain in your abdomen.  You lose consciousness.  You develop a rash. Summary  Dysfunctional uterine bleeding is abnormal bleeding from the uterus.  It includes menstrual bleeding of abnormal duration, volume, or regularity.  Bleeding after sex and after menopause are also considered dysfunctional uterine bleeding. This information is not intended to replace advice given to you by your health care provider. Make sure you discuss any questions you have with your health care provider. Document Revised: 10/12/2017 Document Reviewed: 10/12/2017 Elsevier Patient Education  2020 [employer] Inc[employer]  

## 2020-04-01 ENCOUNTER — Encounter: Payer: 59 | Admitting: Obstetrics and Gynecology

## 2020-04-15 ENCOUNTER — Encounter: Payer: Self-pay | Admitting: Family Medicine

## 2020-05-01 ENCOUNTER — Ambulatory Visit (INDEPENDENT_AMBULATORY_CARE_PROVIDER_SITE_OTHER): Payer: 59 | Admitting: Family Medicine

## 2020-05-01 ENCOUNTER — Other Ambulatory Visit: Payer: Self-pay

## 2020-05-01 ENCOUNTER — Encounter: Payer: Self-pay | Admitting: Family Medicine

## 2020-05-01 VITALS — BP 108/78 | HR 78 | Temp 97.7°F | Resp 16 | Ht 63.0 in | Wt 175.0 lb

## 2020-05-01 DIAGNOSIS — Z0000 Encounter for general adult medical examination without abnormal findings: Secondary | ICD-10-CM | POA: Diagnosis not present

## 2020-05-01 DIAGNOSIS — Z8632 Personal history of gestational diabetes: Secondary | ICD-10-CM

## 2020-05-01 DIAGNOSIS — Z1159 Encounter for screening for other viral diseases: Secondary | ICD-10-CM | POA: Diagnosis not present

## 2020-05-01 DIAGNOSIS — Z114 Encounter for screening for human immunodeficiency virus [HIV]: Secondary | ICD-10-CM

## 2020-05-01 DIAGNOSIS — F4310 Post-traumatic stress disorder, unspecified: Secondary | ICD-10-CM

## 2020-05-01 NOTE — Patient Instructions (Signed)
Preventive Care 21-39 Years Old, Female Preventive care refers to visits with your health care provider and lifestyle choices that can promote health and wellness. This includes:  A yearly physical exam. This may also be called an annual well check.  Regular dental visits and eye exams.  Immunizations.  Screening for certain conditions.  Healthy lifestyle choices, such as eating a healthy diet, getting regular exercise, not using drugs or products that contain nicotine and tobacco, and limiting alcohol use. What can I expect for my preventive care visit? Physical exam Your health care provider will check your:  Height and weight. This may be used to calculate body mass index (BMI), which tells if you are at a healthy weight.  Heart rate and blood pressure.  Skin for abnormal spots. Counseling Your health care provider may ask you questions about your:  Alcohol, tobacco, and drug use.  Emotional well-being.  Home and relationship well-being.  Sexual activity.  Eating habits.  Work and work environment.  Method of birth control.  Menstrual cycle.  Pregnancy history. What immunizations do I need?  Influenza (flu) vaccine  This is recommended every year. Tetanus, diphtheria, and pertussis (Tdap) vaccine  You may need a Td booster every 10 years. Varicella (chickenpox) vaccine  You may need this if you have not been vaccinated. Human papillomavirus (HPV) vaccine  If recommended by your health care provider, you may need three doses over 6 months. Measles, mumps, and rubella (MMR) vaccine  You may need at least one dose of MMR. You may also need a second dose. Meningococcal conjugate (MenACWY) vaccine  One dose is recommended if you are age 19-21 years and a first-year college student living in a residence hall, or if you have one of several medical conditions. You may also need additional booster doses. Pneumococcal conjugate (PCV13) vaccine  You may need  this if you have certain conditions and were not previously vaccinated. Pneumococcal polysaccharide (PPSV23) vaccine  You may need one or two doses if you smoke cigarettes or if you have certain conditions. Hepatitis A vaccine  You may need this if you have certain conditions or if you travel or work in places where you may be exposed to hepatitis A. Hepatitis B vaccine  You may need this if you have certain conditions or if you travel or work in places where you may be exposed to hepatitis B. Haemophilus influenzae type b (Hib) vaccine  You may need this if you have certain conditions. You may receive vaccines as individual doses or as more than one vaccine together in one shot (combination vaccines). Talk with your health care provider about the risks and benefits of combination vaccines. What tests do I need?  Blood tests  Lipid and cholesterol levels. These may be checked every 5 years starting at age 20.  Hepatitis C test.  Hepatitis B test. Screening  Diabetes screening. This is done by checking your blood sugar (glucose) after you have not eaten for a while (fasting).  Sexually transmitted disease (STD) testing.  BRCA-related cancer screening. This may be done if you have a family history of breast, ovarian, tubal, or peritoneal cancers.  Pelvic exam and Pap test. This may be done every 3 years starting at age 21. Starting at age 30, this may be done every 5 years if you have a Pap test in combination with an HPV test. Talk with your health care provider about your test results, treatment options, and if necessary, the need for more tests.   Follow these instructions at home: Eating and drinking   Eat a diet that includes fresh fruits and vegetables, whole grains, lean protein, and low-fat dairy.  Take vitamin and mineral supplements as recommended by your health care provider.  Do not drink alcohol if: ? Your health care provider tells you not to drink. ? You are  pregnant, may be pregnant, or are planning to become pregnant.  If you drink alcohol: ? Limit how much you have to 0-1 drink a day. ? Be aware of how much alcohol is in your drink. In the [place] one drink equals one 12 oz bottle of beer (355 mL), one 5 oz glass of wine (148 mL), or one 1 oz glass of hard liquor (44 mL). Lifestyle  Take daily care of your teeth and gums.  Stay active. Exercise for at least 30 minutes on 5 or more days each week.  Do not use any products that contain nicotine or tobacco, such as cigarettes, e-cigarettes, and chewing tobacco. If you need help quitting, ask your health care provider.  If you are sexually active, practice safe sex. Use a condom or other form of birth control (contraception) in order to prevent pregnancy and STIs (sexually transmitted infections). If you plan to become pregnant, see your health care provider for a preconception visit. What's next?  Visit your health care provider once a year for a well check visit.  Ask your health care provider how often you should have your eyes and teeth checked.  Stay up to date on all vaccines. This information is not intended to replace advice given to you by your health care provider. Make sure you discuss any questions you have with your health care provider. Document Revised: 01/12/2018 Document Reviewed: 01/12/2018 Elsevier Patient Education  2020 [employer].

## 2020-05-01 NOTE — Assessment & Plan Note (Signed)
Related to husband's drug use and finding him unresponsive Improving, PHQ-9 and GAD-7 <5 from September Continue Zoloft for 6 months, plan to taper off Continue Trazodone for sleep PRN.

## 2020-05-01 NOTE — Progress Notes (Signed)
Complete physical exam   Patient: Dawn Mejia   DOB: 17-Jan-1981   39 y.o. Female  MRN: [id number] Visit Date: 05/01/2020  Today's healthcare provider: Shirlee LatchAngela Caesar Mannella, MD   Chief Complaint  Patient presents with  . Annual Exam   Subjective    Dawn Mejia is a 39 y.o. female who presents today for a complete physical exam.  She reports consuming a general diet. The patient does not participate in regular exercise at present. She generally feels fairly well. She reports sleeping fairly well. She does have additional problems to discuss today.   HPI   Dawn Mejia is a G2 P2002 feels that her Zoloft and trazadone have been helpful. Her mood overall is better and she is having nightmares less frequently 1-2 times over the course of two weeks. She does not relive traumatic events but she still thinks about them. She does not feel that her symptoms are impeding her daily function.   Additionally she is interested in attempting conception with her husband. She has been trying for 3-4 months total with her husband. Her previous pregnancy was complicated by gestational diabetes and she has a family history of preeclampsia. She is currently taking a prenatal vitamin. She was last seen by a dentist in September and is not scheduled for any future surgeries. She has 3 dogs in the house, no recent travel and has never tested positive for an STI.  She is currently drinking 1-2x monthly. She has no illicit drug use and a 5 year history of vaping, she does not currently vape.   She is due for her booster shot in February    GAD 7 : Generalized Anxiety Score 05/01/2020 02/28/2020 01/17/2020  Nervous, Anxious, on Edge 0 1 3  Control/stop worrying 0 1 0  Worry too much - different things 0 1 3  Trouble relaxing 1 1 2   Restless 3 1 2   Easily annoyed or irritable 0 0 2  Afraid - awful might happen 0 1 1  Total GAD 7 Score 4 6 13   Anxiety Difficulty Not difficult at all Somewhat difficult  Somewhat difficult      Past Medical History:  Diagnosis Date  . Allergy   . Anxiety   . Asthma    Past Surgical History:  Procedure Laterality Date  . BREAST ENHANCEMENT SURGERY    . CHOLECYSTECTOMY    . ROTATOR CUFF REPAIR Left 2020  . WISDOM TOOTH EXTRACTION     Social History   Socioeconomic History  . Marital status: Married    Spouse name: Not on file  . Number of children: 2  . Years of education: Not on file  . Highest education level: Not on file  Occupational History  . Occupation: Sales executivedental assistant  Tobacco Use  . Smoking status: Never Smoker  . Smokeless tobacco: Never Used  Vaping Use  . Vaping Use: Former  Substance and Sexual Activity  . Alcohol use: Yes    Alcohol/week: 0.0 - 2.0 standard drinks    Comment: occasional  . Drug use: No  . Sexual activity: Yes    Partners: Male    Birth control/protection: None  Other Topics Concern  . Not on file  Social History Narrative  . Not on file   Social Determinants of Health   Financial Resource Strain: Not on file  Food Insecurity: Not on file  Transportation Needs: Not on file  Physical Activity: Not on file  Stress: Not on file  Social Connections: Not on file  Intimate Partner Violence: Not on file   Family Status  Relation Name Status  . Mother  Alive  . Father  Deceased  . Sister  Alive  . Daughter  Alive  . Sister  Alive  . Daughter  Alive  . Mat Uncle  (Not Specified)  . Neg Hx  (Not Specified)   Family History  Problem Relation Age of Onset  . Diabetes Mother   . Arthritis Mother   . Hypertension Father   . Asthma Father   . COPD Father        smoker  . Asthma Sister   . Allergies Sister   . Thyroid disease Sister   . Asthma Daughter   . Allergic Disorder Daughter   . Healthy Sister   . Healthy Daughter   . Colon cancer Maternal Uncle 60  . Breast cancer Neg Hx    Allergies  Allergen Reactions  . Shellfish-Derived Products Anaphylaxis  . Shellfish Allergy   .  Tramadol Itching    welts    Patient Care Team: Erasmo Downer, MD as PCP - General (Family Medicine)   Medications: Outpatient Medications Prior to Visit  Medication Sig  . albuterol (PROVENTIL HFA;VENTOLIN HFA) 108 (90 Base) MCG/ACT inhaler Inhale into the lungs every 6 (six) hours as needed for wheezing or shortness of breath.  . hydrOXYzine (ATARAX/VISTARIL) 10 MG tablet Take 1 tablet (10 mg total) by mouth 3 (three) times daily as needed.  . montelukast (SINGULAIR) 10 MG tablet Take 10 mg by mouth at bedtime.  . pantoprazole (PROTONIX) 40 MG tablet Take by mouth.  . sertraline (ZOLOFT) 50 MG tablet Take 1 tablet (50 mg total) by mouth daily.  . traZODone (DESYREL) 150 MG tablet Take 1 tablet (150 mg total) by mouth at bedtime as needed for sleep.   No facility-administered medications prior to visit.    Review of Systems  Constitutional: Negative.   HENT: Negative.   Eyes: Negative.   Respiratory: Negative.   Cardiovascular: Negative.   Gastrointestinal: Negative.   Endocrine: Negative.   Genitourinary: Negative.   Musculoskeletal: Negative.   Skin: Negative.   Allergic/Immunologic: Negative.   Neurological: Negative.   Hematological: Negative.   Psychiatric/Behavioral: Positive for sleep disturbance.      Objective    BP 108/78 (BP Location: Left Arm, Patient Position: Sitting, Cuff Size: Normal)   Pulse 78   Temp 97.7 F (36.5 C) (Oral)   Resp 16   Ht 5\' 3"  (1.6 m)   Wt 175 lb (79.4 kg)   LMP 04/09/2020 (Exact Date)   SpO2 98%   BMI 31.00 kg/m    Physical Exam   General: Well appearing in NAD Pulm: Lungs CTA bilaterally  Cards: RRR no murmurs rubs or gallops GI: Soft Non-tender Psych: Alert and oriented  Mood: 'fine'  Affect: Pleasant, mood congruent  Conversation: clear and coherent  Thought process: logical and linear  Thought content: Denies SI A/V/H, though endorses thinking about her traumatic experiences  Last depression screening  scores PHQ 2/9 Scores 05/01/2020 02/28/2020 01/17/2020  PHQ - 2 Score 1 0 1  PHQ- 9 Score 3 2 13    Last fall risk screening Fall Risk  05/01/2020  Falls in the past year? 0  Number falls in past yr: 0  Injury with Fall? 0  Risk for fall due to : No Fall Risks  Follow up Falls evaluation completed   Last Audit-C alcohol use screening Alcohol Use  Disorder Test (AUDIT) 05/01/2020  1. How often do you have a drink containing alcohol? 1  2. How many drinks containing alcohol do you have on a typical day when you are drinking? 0  3. How often do you have six or more drinks on one occasion? 0  AUDIT-C Score 1  Alcohol Brief Interventions/Follow-up AUDIT Score <7 follow-up not indicated   A score of 3 or more in women, and 4 or more in men indicates increased risk for alcohol abuse, EXCEPT if all of the points are from question 1   Results for orders placed or performed in visit on 05/01/20  Hepatitis C Antibody  Result Value Ref Range   Hep C Virus Ab <0.1 0.0 - 0.9 s/co ratio  HIV antibody (with reflex)  Result Value Ref Range   HIV Screen 4th Generation wRfx Non Reactive Non Reactive  Hemoglobin A1c  Result Value Ref Range   Hgb A1c MFr Bld 5.4 4.8 - 5.6 %   Est. average glucose Bld gHb Est-mCnc 108 mg/dL  TSH  Result Value Ref Range   TSH 3.320 0.450 - 4.500 uIU/mL  Comprehensive metabolic panel  Result Value Ref Range   Glucose 97 65 - 99 mg/dL   BUN 9 6 - 20 mg/dL   Creatinine, Ser 5.95 0.57 - 1.00 mg/dL   GFR calc non Af Amer 101 >59 mL/min/1.73   GFR calc Af Amer 116 >59 mL/min/1.73   BUN/Creatinine Ratio 12 9 - 23   Sodium 140 134 - 144 mmol/L   Potassium 4.4 3.5 - 5.2 mmol/L   Chloride 102 96 - 106 mmol/L   CO2 25 20 - 29 mmol/L   Calcium 9.6 8.7 - 10.2 mg/dL   Total Protein 6.6 6.0 - 8.5 g/dL   Albumin 4.8 3.8 - 4.8 g/dL   Globulin, Total 1.8 1.5 - 4.5 g/dL   Albumin/Globulin Ratio 2.7 (H) 1.2 - 2.2   Bilirubin Total 0.4 0.0 - 1.2 mg/dL   Alkaline Phosphatase  92 44 - 121 IU/L   AST 69 (H) 0 - 40 IU/L   ALT 136 (H) 0 - 32 IU/L  CBC  Result Value Ref Range   WBC 6.5 3.4 - 10.8 x10E3/uL   RBC 4.76 3.77 - 5.28 x10E6/uL   Hemoglobin 14.0 11.1 - 15.9 g/dL   Hematocrit 63.8 75.6 - 46.6 %   MCV 87 79 - 97 fL   MCH 29.4 26.6 - 33.0 pg   MCHC 34.0 31.5 - 35.7 g/dL   RDW 43.3 29.5 - 18.8 %   Platelets 190 150 - 450 x10E3/uL    Assessment & Plan    Routine Health Maintenance and Physical Exam  Exercise Activities and Dietary recommendations Goals   None     Immunization History  Administered Date(s) Administered  . Hepatitis B 02/20/2002, 05/07/2002, 11/15/2002  . Influenza,inj,Quad PF,6+ Mos 02/07/2019  . Influenza-Unspecified 02/14/2020  . PFIZER SARS-COV-2 Vaccination 12/20/2019, 01/10/2020    Health Maintenance  Topic Date Due  . TETANUS/TDAP  Never done  . PAP SMEAR-Modifier  Never done  . INFLUENZA VACCINE  Completed  . COVID-19 Vaccine  Completed  . Hepatitis C Screening  Completed  . HIV Screening  Completed    Discussed health benefits of physical activity, and encouraged her to engage in regular exercise appropriate for her age and condition.  Problem List Items Addressed This Visit      Other   PTSD (post-traumatic stress disorder)    Related to husband's  drug use and finding him unresponsive Improving, PHQ-9 and GAD-7 <5 from September Continue Zoloft for 6 months, plan to taper off Continue Trazodone for sleep PRN.        Other Visit Diagnoses    Encounter for annual physical exam    -  Primary   Relevant Orders   Hepatitis C Antibody (Completed)   HIV antibody (with reflex) (Completed)   Hemoglobin A1c (Completed)   TSH (Completed)   Comprehensive metabolic panel (Completed)   CBC (Completed)   History of gestational diabetes       Relevant Orders   Hemoglobin A1c (Completed)   Encounter for screening for HIV       Relevant Orders   HIV antibody (with reflex) (Completed)   Need for hepatitis C  screening test       Relevant Orders   Hepatitis C Antibody (Completed)       Return in about 3 months (around 07/30/2020) for MDD/GAD f/u.     I, Shirlee Latch, MD, have reviewed all documentation for this visit. The documentation on 05/02/20 for the exam, diagnosis, procedures, and orders are all accurate and complete.   Alida Greiner, Marzella Schlein, MD, MPH [facility]

## 2020-05-02 ENCOUNTER — Encounter: Payer: Self-pay | Admitting: Family Medicine

## 2020-05-02 DIAGNOSIS — R945 Abnormal results of liver function studies: Secondary | ICD-10-CM

## 2020-05-02 DIAGNOSIS — R7989 Other specified abnormal findings of blood chemistry: Secondary | ICD-10-CM

## 2020-05-02 LAB — CBC
Hematocrit: 41.2 % (ref 34.0–46.6)
Hemoglobin: 14 g/dL (ref 11.1–15.9)
MCH: 29.4 pg (ref 26.6–33.0)
MCHC: 34 g/dL (ref 31.5–35.7)
MCV: 87 fL (ref 79–97)
Platelets: 190 10*3/uL (ref 150–450)
RBC: 4.76 x10E6/uL (ref 3.77–5.28)
RDW: 12.1 % (ref 11.7–15.4)
WBC: 6.5 10*3/uL (ref 3.4–10.8)

## 2020-05-02 LAB — COMPREHENSIVE METABOLIC PANEL
ALT: 136 IU/L — ABNORMAL HIGH (ref 0–32)
AST: 69 IU/L — ABNORMAL HIGH (ref 0–40)
Albumin/Globulin Ratio: 2.7 — ABNORMAL HIGH (ref 1.2–2.2)
Albumin: 4.8 g/dL (ref 3.8–4.8)
Alkaline Phosphatase: 92 IU/L (ref 44–121)
BUN/Creatinine Ratio: 12 (ref 9–23)
BUN: 9 mg/dL (ref 6–20)
Bilirubin Total: 0.4 mg/dL (ref 0.0–1.2)
CO2: 25 mmol/L (ref 20–29)
Calcium: 9.6 mg/dL (ref 8.7–10.2)
Chloride: 102 mmol/L (ref 96–106)
Creatinine, Ser: 0.75 mg/dL (ref 0.57–1.00)
GFR calc Af Amer: 116 mL/min/{1.73_m2} (ref >59)
GFR calc non Af Amer: 101 mL/min/{1.73_m2} (ref >59)
Globulin, Total: 1.8 g/dL (ref 1.5–4.5)
Glucose: 97 mg/dL (ref 65–99)
Potassium: 4.4 mmol/L (ref 3.5–5.2)
Sodium: 140 mmol/L (ref 134–144)
Total Protein: 6.6 g/dL (ref 6.0–8.5)

## 2020-05-02 LAB — HEPATITIS C ANTIBODY: Hep C Virus Ab: <0.1 s/co ratio (ref 0.0–0.9)

## 2020-05-02 LAB — HIV ANTIBODY (ROUTINE TESTING W REFLEX): HIV Screen 4th Generation wRfx: NONREACTIVE

## 2020-05-02 LAB — HEMOGLOBIN A1C
Est. average glucose Bld gHb Est-mCnc: 108 mg/dL
Hgb A1c MFr Bld: 5.4 % (ref 4.8–5.6)

## 2020-05-02 LAB — TSH: TSH: 3.32 u[IU]/mL (ref 0.450–4.500)

## 2020-05-06 NOTE — Telephone Encounter (Signed)
Go ahead and order LFTs and leave slip up front

## 2020-05-16 LAB — HEPATIC FUNCTION PANEL
ALT: 118 IU/L — ABNORMAL HIGH (ref 0–32)
AST: 52 IU/L — ABNORMAL HIGH (ref 0–40)
Albumin: 4.6 g/dL (ref 3.8–4.8)
Alkaline Phosphatase: 93 IU/L (ref 44–121)
Bilirubin Total: 0.6 mg/dL (ref 0.0–1.2)
Bilirubin, Direct: 0.14 mg/dL (ref 0.00–0.40)
Total Protein: 6.5 g/dL (ref 6.0–8.5)

## 2020-05-19 ENCOUNTER — Encounter: Payer: Self-pay | Admitting: Family Medicine

## 2020-05-19 DIAGNOSIS — R748 Abnormal levels of other serum enzymes: Secondary | ICD-10-CM

## 2020-05-21 NOTE — Telephone Encounter (Signed)
-----   Message from Erasmo Downer, MD sent at 05/19/2020  8:16 AM EST ----- Slightly improved, but still high.  Recommend RUQ ultrasound of the liver and consider referral to GI. CMAs, please order RUQ [place] for elevated LFTs.

## 2020-05-21 NOTE — Telephone Encounter (Signed)
Please review to make sure I ordered the right U/S.  Thanks,   -Vernona Rieger

## 2020-05-27 ENCOUNTER — Ambulatory Visit: Payer: 59 | Admitting: Gastroenterology

## 2020-05-27 ENCOUNTER — Encounter: Payer: Self-pay | Admitting: Gastroenterology

## 2020-05-27 VITALS — BP 126/81 | HR 103 | Temp 97.4°F | Ht 63.0 in | Wt 182.0 lb

## 2020-05-27 DIAGNOSIS — R7989 Other specified abnormal findings of blood chemistry: Secondary | ICD-10-CM

## 2020-05-27 NOTE — Patient Instructions (Signed)
Nonalcoholic Fatty Liver Disease Diet, Adult Nonalcoholic fatty liver disease is a condition that causes fat to build up in and around the liver. The disease makes it harder for the liver to work the way that it should. Following a healthy diet can help to keep nonalcoholic fatty liver disease under control. It can also help to prevent or improve conditions that are associated with the disease, such as heart disease, diabetes, high blood pressure, and abnormal cholesterol levels. Along with regular exercise, this diet:  Promotes weight loss.  Helps to control blood sugar levels.  Helps to improve the way that the body uses insulin. What are tips for following this plan? Reading food labels Always check food labels for:  The amount of saturated fat in a food. You should limit your intake of saturated fat. Saturated fat is found in foods that come from animals, including meat and dairy products such as butter, cheese, and whole milk.  The amount of fiber in a food. You should choose high-fiber foods such as fruits, vegetables, and whole grains. Try to get 25-30 grams (g) of fiber a day.   Cooking  When cooking, use heart-healthy oils that are high in monounsaturated fats. These include olive oil, canola oil, and avocado oil.  Limit frying or deep-frying foods. Cook foods using healthy methods such as baking, boiling, steaming, and grilling instead. Meal planning  You may want to keep track of how many calories you take in. Eating the right amount of calories will help you achieve a healthy weight. Meeting with a registered dietitian can help you get started.  Limit how often you eat takeout and fast food. These foods are usually very high in fat, salt, and sugar.  Use the glycemic index (GI) to plan your meals. The index tells you how quickly a food will raise your blood sugar. Choose low-GI foods (GI less than 55). These foods take a longer time to raise blood sugar. A registered dietitian  can help you identify foods lower on the GI scale. Lifestyle  You may want to follow a Mediterranean diet. This diet includes a lot of vegetables, lean meats or fish, whole grains, fruits, and healthy oils and fats. What foods can I eat? Fruits Bananas. Apples. Oranges. Grapes. Papaya. Mango. Pomegranate. Kiwi. Grapefruit. Cherries. Vegetables Lettuce. Spinach. Peas. Beets. Cauliflower. Cabbage. Broccoli. Carrots. Tomatoes. Squash. Eggplant. Herbs. Peppers. Onions. Cucumbers. Brussels sprouts. Yams and sweet potatoes. Beans. Lentils. Grains Whole wheat or whole-grain foods, including breads, crackers, cereals, and pasta. Stone-ground whole wheat. Unsweetened oatmeal. Bulgur. Barley. Quinoa. Brown or wild rice. Corn or whole wheat flour tortillas. Meats and other proteins Lean meats. Poultry. Tofu. Seafood and shellfish. Dairy Low-fat or fat-free dairy products, such as yogurt, cottage cheese, or cheese. Beverages Water. Sugar-free drinks. Tea. Coffee. Low-fat or skim milk. Milk alternatives, such as soy or almond milk. Real fruit juice. Fats and oils Avocado. Canola or olive oil. Nuts and nut butters. Seeds. Seasonings and condiments Mustard. Relish. Low-fat, low-sugar ketchup and barbecue sauce. Low-fat or fat-free mayonnaise. Sweets and desserts Sugar-free sweets. The items listed above may not be a complete list of foods and beverages you can eat. Contact a dietitian for more information.   What foods should I limit or avoid? Meats and other proteins Limit red meat to 1-2 times a week. Dairy [employer]. Fats and oils Palm oil and coconut oil. Fried foods. Other foods Processed foods. Foods that contain a lot of salt or sodium. Sweets and desserts Sweets  that contain sugar. Beverages Sweetened drinks, such as sweet tea, milkshakes, iced sweet drinks, and sodas. Alcohol. The items listed above may not be a complete list of foods and beverages you should avoid. Contact a  dietitian for more information. Where to find more information The [employer] of Diabetes and Digestive and Kidney Diseases: StageSync.si Summary  Nonalcoholic fatty liver disease is a condition that causes fat to build up in and around the liver.  Following a healthy diet can help to keep nonalcoholic fatty liver disease under control. Your diet should be rich in fruits, vegetables, whole grains, and lean proteins.  Limit your intake of saturated fat. Saturated fat is found in foods that come from animals, including meat and dairy products such as butter, cheese, and whole milk.  This diet promotes weight loss, helps to control blood sugar levels, and helps to improve the way that the body uses insulin. This information is not intended to replace advice given to you by your health care provider. Make sure you discuss any questions you have with your health care provider. Document Revised: 08/25/2018 Document Reviewed: 05/25/2018 Elsevier Patient Education  2021 Elsevier Inc. Food Choices for Gastroesophageal Reflux Disease, Adult When you have gastroesophageal reflux disease (GERD), the foods you eat and your eating habits are very important. Choosing the right foods can help ease your discomfort. Think about working with a food expert (dietitian) to help you make good choices. What are tips for following this plan? Reading food labels  Look for foods that are low in saturated fat. Foods that may help with your symptoms include: ? Foods that have less than 5% of daily value (DV) of fat. ? Foods that have 0 grams of trans fat. Cooking  Do not fry your food.  Cook your food by baking, steaming, grilling, or broiling. These are all methods that do not need a lot of fat for cooking.  To add flavor, try to use herbs that are low in spice and acidity. Meal planning  Choose healthy foods that are low in fat, such as: ? Fruits and vegetables. ? Whole grains. ? Low-fat dairy  products. ? Lean meats, fish, and poultry.  Eat small meals often instead of eating 3 large meals each day. Eat your meals slowly in a place where you are relaxed. Avoid bending over or lying down until 2-3 hours after eating.  Limit high-fat foods such as fatty meats or fried foods.  Limit your intake of fatty foods, such as oils, butter, and shortening.  Avoid the following as told by your doctor: ? Foods that cause symptoms. These may be different for different people. Keep a food diary to keep track of foods that cause symptoms. ? Alcohol. ? Drinking a lot of liquid with meals. ? Eating meals during the 2-3 hours before bed.   Lifestyle  Stay at a healthy weight. Ask your doctor what weight is healthy for you. If you need to lose weight, work with your doctor to do so safely.  Exercise for at least 30 minutes on 5 or more days each week, or as told by your doctor.  Wear loose-fitting clothes.  Do not smoke or use any products that contain nicotine or tobacco. If you need help quitting, ask your doctor.  Sleep with the head of your bed higher than your feet. Use a wedge under the mattress or blocks under the bed frame to raise the head of the bed.  Chew sugar-free gum after meals. What  foods should eat? Eat a healthy, well-balanced diet of fruits, vegetables, whole grains, low-fat dairy products, lean meats, fish, and poultry. Each person is different. Foods that may cause symptoms in one person may not cause any symptoms in another person. Work with your doctor to find foods that are safe for you. The items listed above may not be a complete list of what you can eat and drink. Contact a food expert for more options.   What foods should I avoid? Limiting some of these foods may help in managing the symptoms of GERD. Everyone is different. Talk with a food expert or your doctor to help you find the exact foods to avoid, if any. Fruits Any fruits prepared with added fat. Any fruits  that cause symptoms. For some people, this may include citrus fruits, such as oranges, grapefruit, pineapple, and lemons. Vegetables Deep-fried vegetables. [place] fries. Any vegetables prepared with added fat. Any vegetables that cause symptoms. For some people, this may include tomatoes and tomato products, chili peppers, onions and garlic, and horseradish. Grains Pastries or quick breads with added fat. Meats and other proteins High-fat meats, such as fatty beef or pork, hot dogs, ribs, ham, sausage, salami, and bacon. Fried meat or protein, including fried fish and fried chicken. Nuts and nut butters, in large amounts. Dairy Whole milk and chocolate milk. Sour cream. Cream. Ice cream. Cream cheese. Milkshakes. Fats and oils Butter. Margarine. Shortening. Ghee. Beverages Coffee and tea, with or without caffeine. Carbonated beverages. Sodas. Energy drinks. Fruit juice made with acidic fruits, such as orange or grapefruit. Tomato juice. Alcoholic drinks. Sweets and desserts Chocolate and cocoa. Donuts. Seasonings and condiments Pepper. Peppermint and spearmint. Added salt. Any condiments, herbs, or seasonings that cause symptoms. For some people, this may include curry, hot sauce, or vinegar-based salad dressings. The items listed above may not be a complete list of what you should not eat and drink. Contact a food expert for more options. Questions to ask your doctor Diet and lifestyle changes are often the first steps that are taken to manage symptoms of GERD. If diet and lifestyle changes do not help, talk with your doctor about taking medicines. Where to find more information  International Foundation for Gastrointestinal Disorders: aboutgerd.org Summary  When you have GERD, food and lifestyle choices are very important in easing your symptoms.  Eat small meals often instead of 3 large meals a day. Eat your meals slowly and in a place where you are relaxed.  Avoid bending over or  lying down until 2-3 hours after eating.  Limit high-fat foods such as fatty meats or fried foods. This information is not intended to replace advice given to you by your health care provider. Make sure you discuss any questions you have with your health care provider. Document Revised: 11/12/2019 Document Reviewed: 11/12/2019 Elsevier Patient Education  2021 [employer].

## 2020-05-27 NOTE — Progress Notes (Signed)
Arlyss Repress, MD [address]  [address]  [place], [place] [address]  Main: [phone]  Fax: [phone]    Gastroenterology Consultation  Referring Provider:     Erasmo Downer, MD Primary Care Physician:  Erasmo Downer, MD Primary Gastroenterologist:  Dr. Arlyss Repress Reason for Consultation:     Elevated LFTs        HPI:   BRADLEY HANDYSIDE is a 40 y.o. female referred by Dr. Beryle Flock, Marzella Schlein, MD  for consultation & management of elevated LFTs.  Patient was first noted to have elevated ALT 41 in 4/21 and most recently during her annual physical, she was found to have increasing aminotransferases AST/ALT 69/136, repeat enzymes in 2 weeks were again elevated at 52/118. Patient is scheduled to undergo ultrasound of the liver tomorrow. She is s/p cholecystectomy. She denies any abdominal pain. She does have history of acid reflux for which she was taking H2 blocker which did not relieve her symptoms. She is started on Protonix 40 mg daily by her pulmonologist, Dr. Meredeth Ide due to chest pain, cough and asthma attacks. Since then, her acid reflux has been well controlled as well as her respiratory symptoms have been controlled. She recently switched back to H2 blocker as she is planning pregnancy. Patient also reports bowel habits of variable consistency, generally loose associated with bloating since cholecystectomy. She does acknowledge consuming carbonated beverages daily. She does report gaining significant weight within last 1 year. 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 Allergy   . Anxiety   . Asthma     Past Surgical History:  Procedure Laterality Date  . BREAST ENHANCEMENT  SURGERY    . CHOLECYSTECTOMY    . ROTATOR CUFF REPAIR Left 2020  . WISDOM TOOTH EXTRACTION      Current Outpatient Medications:  .  albuterol (PROVENTIL HFA;VENTOLIN HFA) 108 (90 Base) MCG/ACT inhaler, Inhale into the lungs every 6 (six) hours as needed for wheezing or shortness of breath., Disp: , Rfl:  .  hydrOXYzine (ATARAX/VISTARIL) 10 MG tablet, Take 1 tablet (10 mg total) by mouth 3 (three) times daily as needed., Disp: 30 tablet, Rfl: 3 .  montelukast (SINGULAIR) 10 MG tablet, Take 10 mg by mouth at bedtime., Disp: , Rfl:  .  sertraline (ZOLOFT) 50 MG tablet, Take 1 tablet (50 mg total) by mouth daily., Disp: 30 tablet, Rfl: 3 .  traZODone (DESYREL) 150 MG tablet, Take 1 tablet (150 mg total) by mouth at bedtime as needed for sleep., Disp: 30 tablet, Rfl: 5 .  pantoprazole (PROTONIX) 40 MG tablet, Take by mouth. (Patient not taking: Reported on 05/27/2020), Disp: , Rfl:    Family History  Problem Relation Age of Onset  . Diabetes Mother   . Arthritis Mother   . Hypertension Father   . Asthma Father   . COPD Father        smoker  . Asthma Sister   . Allergies Sister   . Thyroid disease Sister   . Asthma Daughter   . Allergic Disorder Daughter   . Healthy Sister   . Healthy Daughter   . Colon cancer Maternal Uncle 60  . Breast cancer Neg Hx      Social  History   Tobacco Use  . Smoking status: Never Smoker  . Smokeless tobacco: Never Used  Vaping Use  . Vaping Use: Former  Substance Use Topics  . Alcohol use: Yes    Alcohol/week: 0.0 - 2.0 standard drinks    Comment: occasional  . Drug use: No    Allergies as of 05/27/2020 - Review Complete 05/27/2020  Allergen Reaction Noted  . Shellfish-derived products Anaphylaxis 10/01/2016  . Shellfish allergy  07/21/2007  . Tramadol Itching 05/26/2018    Review of Systems:    All systems reviewed and negative except where noted in HPI.   Physical Exam:  BP 126/81 (BP Location: Left Arm, Patient Position: Sitting,  Cuff Size: Normal)   Pulse (!) 103   Temp (!) 97.4 F (36.3 C) (Oral)   Ht 5\' 3"  (1.6 m)   Wt 182 lb (82.6 kg)   BMI 32.24 kg/m  No LMP recorded.  General:   Alert,  Well-developed, well-nourished, pleasant and cooperative in NAD Head:  Normocephalic and atraumatic. Eyes:  Sclera clear, no icterus.   Conjunctiva pink. Ears:  Normal auditory acuity. Nose:  No deformity, discharge, or lesions. Mouth:  No deformity or lesions,oropharynx pink & moist. Neck:  Supple; no masses or thyromegaly. Lungs:  Respirations even and unlabored.  Clear throughout to auscultation.   No wheezes, crackles, or rhonchi. No acute distress. Heart:  Regular rate and rhythm; no murmurs, clicks, rubs, or gallops. Abdomen:  Normal bowel sounds. Soft, non-tender and non-distended without masses, hepatosplenomegaly or hernias noted.  No guarding or rebound tenderness.   Rectal: Not performed Msk:  Symmetrical without gross deformities. Good, equal movement & strength bilaterally. Pulses:  Normal pulses noted. Extremities:  No clubbing or edema.  No cyanosis. Neurologic:  Alert and oriented x3;  grossly normal neurologically. Skin:  Intact without significant lesions or rashes. No jaundice. Lymph Nodes:  No significant cervical adenopathy. Psych:  Alert and cooperative. Normal mood and affect.  Imaging Studies: None  Assessment and Plan:   KANAYA GUNNARSON is a 40 y.o. pleasant Caucasian female with BMI 32 is seen in consultation for elevated LFTs, s/p cholecystectomy  Elevated LFTs, most likely secondary to fatty liver with recent weight gain and lifestyle Follow-up on ultrasound liver Recommend secondary liver disease work-up Discussed about healthy lifestyle and weight loss  Loose stools and abdominal bloating Screen for celiac disease Advised to avoid carbonated beverages and artificial sweeteners  Advised her to postpone her pregnancy for next 6 months pending normalization of LFTs   Follow up  in 3 months   24, MD

## 2020-05-28 ENCOUNTER — Other Ambulatory Visit: Payer: Self-pay

## 2020-05-28 ENCOUNTER — Ambulatory Visit
Admission: RE | Admit: 2020-05-28 | Discharge: 2020-05-28 | Disposition: A | Discharge status: Home / Self Care | Payer: 59 | Source: Ambulatory Visit | Attending: Family Medicine | Admitting: Family Medicine

## 2020-05-28 DIAGNOSIS — R748 Abnormal levels of other serum enzymes: Secondary | ICD-10-CM | POA: Insufficient documentation

## 2020-05-28 LAB — IRON,TIBC AND FERRITIN PANEL: Total Iron Binding Capacity: 292 ug/dL (ref 250–450)

## 2020-05-29 ENCOUNTER — Encounter: Payer: Self-pay | Admitting: Family Medicine

## 2020-05-29 LAB — IRON,TIBC AND FERRITIN PANEL
Ferritin: 229 ng/mL — ABNORMAL HIGH (ref 15–150)
Iron Saturation: 17 % (ref 15–55)
Iron: 51 ug/dL (ref 27–159)
UIBC: 241 ug/dL (ref 131–425)

## 2020-05-29 LAB — MITOCHONDRIAL/SMOOTH MUSCLE AB PNL
Mitochondrial Ab: <20 Units (ref 0.0–20.0)
Smooth Muscle Ab: 13 Units (ref 0–19)

## 2020-05-29 LAB — ANA: Anti Nuclear Antibody (ANA): NEGATIVE

## 2020-05-29 LAB — HEPATITIS PANEL, ACUTE
Hep A IgM: NEGATIVE
Hep B C IgM: NEGATIVE
Hep C Virus Ab: <0.1 s/co ratio (ref 0.0–0.9)
Hepatitis B Surface Ag: NEGATIVE

## 2020-05-29 LAB — CELIAC DISEASE PANEL
Endomysial IgA: NEGATIVE
IgA/Immunoglobulin A, Serum: 99 mg/dL (ref 87–352)
Transglutaminase IgA: <2 U/mL (ref 0–3)

## 2020-05-29 LAB — ALPHA-1-ANTITRYPSIN: A-1 Antitrypsin: 113 mg/dL (ref 100–188)

## 2020-05-29 LAB — CERULOPLASMIN: Ceruloplasmin: 22.4 mg/dL (ref 19.0–39.0)

## 2020-06-07 ENCOUNTER — Other Ambulatory Visit: Payer: Self-pay | Admitting: Family Medicine

## 2020-07-02 ENCOUNTER — Ambulatory Visit: Payer: 59 | Admitting: Gastroenterology

## 2020-07-17 ENCOUNTER — Encounter: Payer: Self-pay | Admitting: Family Medicine

## 2020-07-18 ENCOUNTER — Other Ambulatory Visit: Payer: Self-pay | Admitting: Family Medicine

## 2020-07-18 MED ORDER — TRAZODONE HCL 150 MG PO TABS: 150.0000 mg | ORAL_TABLET | Freq: Every evening | ORAL | 1 refills | Status: DC | PRN

## 2020-07-18 MED ORDER — HYDROXYZINE HCL 10 MG PO TABS: 10.0000 mg | ORAL_TABLET | Freq: Three times a day (TID) | ORAL | 1 refills | Status: DC | PRN

## 2020-07-18 MED ORDER — SERTRALINE HCL 50 MG PO TABS: 50.0000 mg | ORAL_TABLET | Freq: Every day | ORAL | 1 refills | Status: DC

## 2020-07-18 NOTE — Telephone Encounter (Signed)
Please review. How many refills? Thanks!

## 2020-07-18 NOTE — Telephone Encounter (Signed)
[facility] faxed refill request for the following medications:  1. sertraline (ZOLOFT) 50 MG tablet 2. traZODone (DESYREL) 150 MG tablet 3. hydrOXYzine (ATARAX/VISTARIL) 10 MG tablet  LOV: 02/28/2020 Pt noted in MyChart message 07/17/20 that she is switching over to [facility]. Please advise. Thanks TNP

## 2020-07-31 ENCOUNTER — Ambulatory Visit: Payer: 59 | Admitting: Family Medicine

## 2020-07-31 NOTE — Progress Notes (Signed)
Established patient visit   Patient: Dawn Mejia   DOB: 01-23-1981   40 y.o. Female  MRN: [id number] Visit Date: 08/01/2020  Today's healthcare provider: Shirlee Latch, MD   Chief Complaint  Patient presents with   Depression   Anxiety   Subjective    HPI  Depression, Follow-up  She  was last seen for this 3 months ago. Changes made at last visit include no changes.   She reports excellent compliance with treatment. She is not having side effects.   She reports excellent tolerance of treatment. Current symptoms include: difficulty concentrating and insomnia She feels she is Improved since last visit.  Depression screen [facility] 2/9 08/01/2020 05/01/2020 02/28/2020  Decreased Interest 0 1 0  Down, Depressed, Hopeless 0 0 0  PHQ - 2 Score 0 1 0  Altered sleeping 1 1 1   Tired, decreased energy 0 0 0  Change in appetite 0 0 0  Feeling bad or failure about yourself  0 0 0  Trouble concentrating 1 1 1   Moving slowly or fidgety/restless 0 0 0  Suicidal thoughts 0 - 0  PHQ-9 Score 2 3 2   Difficult doing work/chores Not difficult at all Not difficult at all Somewhat difficult    ----------------------------------------------------------------------------------------- Anxiety, Follow-up  She was last seen for anxiety 3 months ago. Changes made at last visit include no changes.   She reports excellent compliance with treatment. She reports excellent tolerance of treatment. She is not having side effects.   She feels her anxiety is mild and Improved since last visit.  Symptoms: No chest pain Yes difficulty concentrating  No dizziness No fatigue  No feelings of losing control Yes insomnia  No irritable No palpitations  No panic attacks No racing thoughts  No shortness of breath No sweating  No tremors/shakes    GAD-7 Results GAD-7 Generalized Anxiety Disorder Screening Tool 05/01/2020 02/28/2020 01/17/2020  1. Feeling Nervous, Anxious, or on Edge 0 1 3   2. Not Being Able to Stop or Control Worrying 0 1 0  3. Worrying Too Much About Different Things 0 1 3  4. Trouble Relaxing 1 1 2   5. Being So Restless it's Hard To Sit Still 3 1 2   6. Becoming Easily Annoyed or Irritable 0 0 2  7. Feeling Afraid As If Something Awful Might Happen 0 1 1  Total GAD-7 Score 4 6 13   Difficulty At Work, Home, or Getting  Along With Others? Not difficult at all Somewhat difficult Somewhat difficult    PHQ-9 Scores PHQ9 SCORE ONLY 08/01/2020 05/01/2020 02/28/2020  PHQ-9 Total Score 2 3 2     ---------------------------------------------------------------------------------------------------   Patient Active Problem List   Diagnosis Date Noted   PTSD (post-traumatic stress disorder) 01/17/2020   Insomnia 06/08/2019   Anxiety 06/08/2019   Allergic rhinitis 06/08/2019   Asthma, stable, mild persistent 10/01/2016   Asthma 07/21/2007   Social History   Tobacco Use   Smoking status: Never Smoker   Smokeless tobacco: Never Used  Vaping Use   Vaping Use: Former  Substance Use Topics   Alcohol use: Yes    Alcohol/week: 0.0 - 2.0 standard drinks    Comment: occasional   Drug use: No   Allergies  Allergen Reactions   Shellfish-Derived Products Anaphylaxis   Shellfish Allergy    Tramadol Itching    welts       Medications: Outpatient Medications Prior to Visit  Medication Sig   albuterol (PROVENTIL HFA;VENTOLIN HFA) 108 (90  Base) MCG/ACT inhaler Inhale into the lungs every 6 (six) hours as needed for wheezing or shortness of breath.   hydrOXYzine (ATARAX/VISTARIL) 10 MG tablet Take 1 tablet (10 mg total) by mouth 3 (three) times daily as needed.   montelukast (SINGULAIR) 10 MG tablet Take 10 mg by mouth at bedtime.   pantoprazole (PROTONIX) 40 MG tablet Take by mouth.   sertraline (ZOLOFT) 50 MG tablet Take 1 tablet (50 mg total) by mouth daily.   traZODone (DESYREL) 150 MG tablet Take 1 tablet (150 mg total) by mouth at  bedtime as needed for sleep.   No facility-administered medications prior to visit.    Review of Systems  Constitutional: Negative for appetite change, fatigue and unexpected weight change.  Respiratory: Negative for cough, chest tightness, shortness of breath and wheezing.   Cardiovascular: Negative for chest pain and palpitations.  Psychiatric/Behavioral: Positive for decreased concentration and sleep disturbance.    Last CBC Lab Results  Component Value Date   WBC 6.5 05/01/2020   HGB 14.0 05/01/2020   HCT 41.2 05/01/2020   MCV 87 05/01/2020   MCH 29.4 05/01/2020   RDW 12.1 05/01/2020   PLT 190 05/01/2020   Last metabolic panel Lab Results  Component Value Date   GLUCOSE 97 05/01/2020   NA 140 05/01/2020   K 4.4 05/01/2020   CL 102 05/01/2020   CO2 25 05/01/2020   BUN 9 05/01/2020   CREATININE 0.75 05/01/2020   GFRNONAA 101 05/01/2020   GFRAA 116 05/01/2020   CALCIUM 9.6 05/01/2020   PROT 6.5 05/15/2020   ALBUMIN 4.6 05/15/2020   LABGLOB 1.8 05/01/2020   AGRATIO 2.7 (H) 05/01/2020   BILITOT 0.6 05/15/2020   ALKPHOS 93 05/15/2020   AST 52 (H) 05/15/2020   ALT 118 (H) 05/15/2020   Last thyroid functions Lab Results  Component Value Date   TSH 3.320 05/01/2020        Objective    BP 100/78 (BP Location: Left Arm, Patient Position: Sitting, Cuff Size: Large)    Pulse 78    Temp 98 F (36.7 C) (Oral)    Resp 16    Wt 184 lb (83.5 kg)    LMP 07/14/2020 (Exact Date)    BMI 32.59 kg/m  BP Readings from Last 3 Encounters:  08/01/20 100/78  05/27/20 126/81  05/01/20 108/78   Wt Readings from Last 3 Encounters:  08/01/20 184 lb (83.5 kg)  05/27/20 182 lb (82.6 kg)  05/01/20 175 lb (79.4 kg)      Physical Exam Vitals reviewed.  Constitutional:      General: She is not in acute distress.    Appearance: Normal appearance. She is well-developed. She is not diaphoretic.  HENT:     Head: Normocephalic and atraumatic.  Eyes:     General: No scleral  icterus.    Conjunctiva/sclera: Conjunctivae normal.  Neck:     Thyroid: No thyromegaly.  Cardiovascular:     Rate and Rhythm: Normal rate and regular rhythm.  Pulmonary:     Effort: Pulmonary effort is normal. No respiratory distress.  Musculoskeletal:     Cervical back: Neck supple.  Lymphadenopathy:     Cervical: No cervical adenopathy.  Neurological:     Mental Status: She is alert. Mental status is at baseline.  Psychiatric:        Mood and Affect: Mood normal.        Behavior: Behavior normal.       No results found for any visits  on 08/01/20.  Assessment & Plan     Problem List Items Addressed This Visit      Other   Insomnia    Chronic and stable Continue trazodone at current dose      Anxiety - Primary    Improving Still has moments of breakthrough Related to husband's drug use and loss of trust Continue zoloft at current dose Encourage therapy Repeat GAD7 and PHQ9 at next visit      PTSD (post-traumatic stress disorder)    Related to husband's drug use and finding him unresponsive Improving Continue zoloft and trazodone         Return in about 6 months (around 02/01/2021).      I, Shirlee Latch, MD, have reviewed all documentation for this visit. The documentation on 08/01/20 for the exam, diagnosis, procedures, and orders are all accurate and complete.   Kirsty Monjaraz, Marzella Schlein, MD, MPH [facility]

## 2020-08-01 ENCOUNTER — Encounter: Payer: Self-pay | Admitting: Family Medicine

## 2020-08-01 ENCOUNTER — Ambulatory Visit: Payer: 59 | Admitting: Family Medicine

## 2020-08-01 ENCOUNTER — Other Ambulatory Visit: Payer: Self-pay

## 2020-08-01 VITALS — BP 100/78 | HR 78 | Temp 98.0°F | Resp 16 | Wt 184.0 lb

## 2020-08-01 DIAGNOSIS — F419 Anxiety disorder, unspecified: Secondary | ICD-10-CM | POA: Diagnosis not present

## 2020-08-01 DIAGNOSIS — F5104 Psychophysiologic insomnia: Secondary | ICD-10-CM | POA: Diagnosis not present

## 2020-08-01 DIAGNOSIS — F4310 Post-traumatic stress disorder, unspecified: Secondary | ICD-10-CM | POA: Diagnosis not present

## 2020-08-01 NOTE — Assessment & Plan Note (Signed)
Related to husband's drug use and finding him unresponsive Improving Continue zoloft and trazodone

## 2020-08-01 NOTE — Assessment & Plan Note (Signed)
Improving Still has moments of breakthrough Related to husband's drug use and loss of trust Continue zoloft at current dose Encourage therapy Repeat GAD7 and PHQ9 at next visit

## 2020-08-01 NOTE — Assessment & Plan Note (Signed)
Chronic and stable Continue trazodone at current dose 

## 2020-08-28 ENCOUNTER — Ambulatory Visit: Payer: 59 | Admitting: Gastroenterology
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
## 2020-09-16 NOTE — Patient Instructions (Incomplete)
Preventive Care 40-40 Years Old, Female Preventive care refers to lifestyle choices and visits with your health care provider that can promote health and wellness. This includes:  A yearly physical exam. This is also called an annual wellness visit.  Regular dental and eye exams.  Immunizations.  Screening for certain conditions.  Healthy lifestyle choices, such as: ? Eating a healthy diet. ? Getting regular exercise. ? Not using drugs or products that contain nicotine and tobacco. ? Limiting alcohol use. What can I expect for my preventive care visit? Physical exam Your health care provider will check your:  Height and weight. These may be used to calculate your BMI (body mass index). BMI is a measurement that tells if you are at a healthy weight.  Heart rate and blood pressure.  Body temperature.  Skin for abnormal spots. Counseling Your health care provider may ask you questions about your:  Past medical problems.  Family's medical history.  Alcohol, tobacco, and drug use.  Emotional well-being.  Home life and relationship well-being.  Sexual activity.  Diet, exercise, and sleep habits.  Work and work Statistician.  Access to firearms.  Method of birth control.  Menstrual cycle.  Pregnancy history. What immunizations do I need? Vaccines are usually given at various ages, according to a schedule. Your health care provider will recommend vaccines for you based on your age, medical history, and lifestyle or other factors, such as travel or where you work.   What tests do I need? Blood tests  Lipid and cholesterol levels. These may be checked every 5 years, or more often if you are over 3 years old.  Hepatitis C test.  Hepatitis B test. Screening  Lung cancer screening. You may have this screening every year starting at age 73 if you have a 30-pack-year history of smoking and currently smoke or have quit within the past 15 years.  Colorectal cancer  screening. ? All adults should have this screening starting at age 52 and continuing until age 17. ? Your health care provider may recommend screening at age 49 if you are at increased risk. ? You will have tests every 1-10 years, depending on your results and the type of screening test.  Diabetes screening. ? This is done by checking your blood sugar (glucose) after you have not eaten for a while (fasting). ? You may have this done every 1-3 years.  Mammogram. ? This may be done every 1-2 years. ? Talk with your health care provider about when you should start having regular mammograms. This may depend on whether you have a family history of breast cancer.  BRCA-related cancer screening. This may be done if you have a family history of breast, ovarian, tubal, or peritoneal cancers.  Pelvic exam and Pap test. ? This may be done every 3 years starting at age 10. ? Starting at age 11, this may be done every 5 years if you have a Pap test in combination with an HPV test. Other tests  STD (sexually transmitted disease) testing, if you are at risk.  Bone density scan. This is done to screen for osteoporosis. You may have this scan if you are at high risk for osteoporosis. Talk with your health care provider about your test results, treatment options, and if necessary, the need for more tests. Follow these instructions at home: Eating and drinking  Eat a diet that includes fresh fruits and vegetables, whole grains, lean protein, and low-fat dairy products.  Take vitamin and mineral supplements  as recommended by your health care provider.  Do not drink alcohol if: ? Your health care provider tells you not to drink. ? You are pregnant, may be pregnant, or are planning to become pregnant.  If you drink alcohol: ? Limit how much you have to 0-1 drink a day. ? Be aware of how much alcohol is in your drink. In the [place] one drink equals one 12 oz bottle of beer (355 mL), one 5 oz glass of  wine (148 mL), or one 1 oz glass of hard liquor (44 mL).   Lifestyle  Take daily care of your teeth and gums. Brush your teeth every morning and night with fluoride toothpaste. Floss one time each day.  Stay active. Exercise for at least 30 minutes 5 or more days each week.  Do not use any products that contain nicotine or tobacco, such as cigarettes, e-cigarettes, and chewing tobacco. If you need help quitting, ask your health care provider.  Do not use drugs.  If you are sexually active, practice safe sex. Use a condom or other form of protection to prevent STIs (sexually transmitted infections).  If you do not wish to become pregnant, use a form of birth control. If you plan to become pregnant, see your health care provider for a prepregnancy visit.  If told by your health care provider, take low-dose aspirin daily starting at age 75.  Find healthy ways to cope with stress, such as: ? Meditation, yoga, or listening to music. ? Journaling. ? Talking to a trusted person. ? Spending time with friends and family. Safety  Always wear your seat belt while driving or riding in a vehicle.  Do not drive: ? If you have been drinking alcohol. Do not ride with someone who has been drinking. ? When you are tired or distracted. ? While texting.  Wear a helmet and other protective equipment during sports activities.  If you have firearms in your house, make sure you follow all gun safety procedures. What's next?  Visit your health care provider once a year for an annual wellness visit.  Ask your health care provider how often you should have your eyes and teeth checked.  Stay up to date on all vaccines. This information is not intended to replace advice given to you by your health care provider. Make sure you discuss any questions you have with your health care provider. Document Revised: 02/05/2020 Document Reviewed: 01/12/2018 Elsevier Patient Education  2021 [place] Breast self-awareness is knowing how your breasts look and feel. Doing breast self-awareness is important. It allows you to catch a breast problem early while it is still small and can be treated. All women should do breast self-awareness, including women who have had breast implants. Tell your doctor if you notice a change in your breasts. What you need:  A mirror.  A well-lit room. How to do a breast self-exam A breast self-exam is one way to learn what is normal for your breasts and to check for changes. To do a breast self-exam: Look for changes 1. Take off all the clothes above your waist. 2. Stand in front of a mirror in a room with good lighting. 3. Put your hands on your hips. 4. Push your hands down. 5. Look at your breasts and nipples in the mirror to see if one breast or nipple looks different from the other. Check to see if: ? The shape of one breast is different. ? The size of  one breast is different. ? There are wrinkles, dips, and bumps in one breast and not the other. 6. Look at each breast for changes in the skin, such as: ? Redness. ? Scaly areas. 7. Look for changes in your nipples, such as: ? Liquid around the nipples. ? Bleeding. ? Dimpling. ? Redness. ? A change in where the nipples are.   Feel for changes 1. Lie on your back on the floor. 2. Feel each breast. To do this, follow these steps: ? Pick a breast to feel. ? Put the arm closest to that breast above your head. ? Use your other arm to feel the nipple area of your breast. Feel the area with the pads of your three middle fingers by making small circles with your fingers. For the first circle, press lightly. For the second circle, press harder. For the third circle, press even harder. ? Keep making circles with your fingers at the different pressures as you move down your breast. Stop when you feel your ribs. ? Move your fingers a little toward the center of your body. ? Start making  circles with your fingers again, this time going up until you reach your collarbone. ? Keep making up-and-down circles until you reach your armpit. Remember to keep using the three pressures. ? Feel the other breast in the same way. 3. Sit or stand in the tub or shower. 4. With soapy water on your skin, feel each breast the same way you did in step 2 when you were lying on the floor.   Write down what you find Writing down what you find can help you remember what to tell your doctor. Write down:  What is normal for each breast.  Any changes you find in each breast, including: ? The kind of changes you find. ? Whether you have pain. ? Size and location of any lumps.  When you last had your menstrual period. General tips  Check your breasts every month.  If you are breastfeeding, the best time to check your breasts is after you feed your baby or after you use a breast pump.  If you get menstrual periods, the best time to check your breasts is 5-7 days after your menstrual period is over.  With time, you will become comfortable with the self-exam, and you will begin to know if there are changes in your breasts. Contact a doctor if you:  See a change in the shape or size of your breasts or nipples.  See a change in the skin of your breast or nipples, such as red or scaly skin.  Have fluid coming from your nipples that is not normal.  Find a lump or thick area that was not there before.  Have pain in your breasts.  Have any concerns about your breast health. Summary  Breast self-awareness includes looking for changes in your breasts, as well as feeling for changes within your breasts.  Breast self-awareness should be done in front of a mirror in a well-lit room.  You should check your breasts every month. If you get menstrual periods, the best time to check your breasts is 5-7 days after your menstrual period is over.  Let your doctor know of any changes you see in your  breasts, including changes in size, changes on the skin, pain or tenderness, or fluid from your nipples that is not normal. This information is not intended to replace advice given to you by your health care provider. Make sure  you discuss any questions you have with your health care provider. Document Revised: 12/20/2017 Document Reviewed: 12/20/2017 Elsevier Patient Education  [place].

## 2020-09-17 ENCOUNTER — Encounter: Payer: 59 | Admitting: Obstetrics and Gynecology

## 2020-09-17 DIAGNOSIS — Z124 Encounter for screening for malignant neoplasm of cervix: Secondary | ICD-10-CM

## 2020-09-17 DIAGNOSIS — Z01419 Encounter for gynecological examination (general) (routine) without abnormal findings: Secondary | ICD-10-CM

## 2020-09-22 ENCOUNTER — Encounter: Payer: Self-pay | Admitting: Obstetrics and Gynecology

## 2020-10-10 LAB — HEMOGLOBIN A1C: Hemoglobin A1C: 5.4

## 2020-12-09 NOTE — Progress Notes (Signed)
GYNECOLOGY ANNUAL PHYSICAL EXAM PROGRESS NOTE  Subjective:    Dawn Mejia is a 40 y.o. G12P2002 female who presents for an annual exam. The patient has no complaints today. The patient is sexually active. Her IUD was removed in January 2021. She states she is currently trying conceive but has been unable and is currently seeing a fertility doctor at [facility] that has prescribed her Clomid. Patient was recently diagnosed with thyroid issues and prescribed medication.  The patient wears seatbelts: yes. The patient participates in regular exercise: sometimes. 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 The patient reports that there is not domestic violence in her life.     Gynecologic History LMP 11/19/2020  Since IUD removal 05/2019 menses lasting 5-6 days with moderate flow. Reports changing her tampon 3-4x in 12 hours. Mild cramping with menstruation but reports ovulation pain.  Menarche age: 59-13 Contraception: none. Had IUD removed 05/2019.  History of STI's: Pt denies Last Pap: 2-3 years ago (peformed at [facility]). Results were: normal.  Denies h/o abnormal pap smears.   OB History  Gravida Para Term Preterm AB Living  2 2 2  0 0 2  SAB IAB Ectopic Multiple Live Births  0 0 0 0 2    # Outcome Date GA Lbr Len/2nd Weight Sex Delivery Anes PTL Lv  2 Term 06/21/05    F Vag-Spont   LIV  1 Term 06/01/04    F Vag-Spont   LIV    Past Medical History:  Diagnosis Date   Allergy    Anxiety    Asthma     Past Surgical History:  Procedure Laterality Date   BREAST ENHANCEMENT SURGERY     CHOLECYSTECTOMY     ROTATOR CUFF REPAIR Left 2020   WISDOM TOOTH EXTRACTION      Family History  Problem Relation Age of Onset   Diabetes Mother    Arthritis Mother    Hypertension Father    Asthma Father    COPD Father        smoker   Asthma Sister    Allergies Sister    Thyroid disease Sister    Asthma Daughter    Allergic Disorder  Daughter    Healthy Sister    Healthy Daughter    Colon cancer Maternal Uncle 60   Breast cancer Neg Hx     Social History   Socioeconomic History   Marital status: Married    Spouse name: Not on file   Number of children: 2   Years of education: Not on file   Highest education level: Not on file  Occupational History   Occupation: 2021  Tobacco Use   Smoking status: Never   Smokeless tobacco: Never  Vaping Use   Vaping Use: Former  Substance and Sexual Activity   Alcohol use: Yes    Alcohol/week: 0.0 - 2.0 standard drinks    Comment: occasional   Drug use: No   Sexual activity: Yes    Partners: Male    Birth control/protection: None  Other Topics Concern   Not on file  Social History Narrative   Not on file   Social Determinants of Health   Financial Resource Strain: Not on file  Food Insecurity: Not on file  Transportation Needs: Not on file  Physical Activity: Not on file  Stress: Not on file  Social Connections: Not on file  Intimate Partner Violence: Not on file    Current Outpatient Medications  on File Prior to Visit  Medication Sig Dispense Refill   albuterol (PROVENTIL HFA;VENTOLIN HFA) 108 (90 Base) MCG/ACT inhaler Inhale into the lungs every 6 (six) hours as needed for wheezing or shortness of breath.     hydrOXYzine (ATARAX/VISTARIL) 10 MG tablet Take 1 tablet (10 mg total) by mouth 3 (three) times daily as needed. 90 tablet 1   montelukast (SINGULAIR) 10 MG tablet Take 10 mg by mouth at bedtime.     pantoprazole (PROTONIX) 40 MG tablet Take by mouth.     sertraline (ZOLOFT) 50 MG tablet Take 1 tablet (50 mg total) by mouth daily. 90 tablet 1   traZODone (DESYREL) 150 MG tablet Take 1 tablet (150 mg total) by mouth at bedtime as needed for sleep. 90 tablet 1   No current facility-administered medications on file prior to visit.    Allergies  Allergen Reactions   Shellfish-Derived Products Anaphylaxis   Shellfish Allergy    Tramadol  Itching    welts    Review of Systems Constitutional: negative for chills, fatigue, fevers and sweats Eyes: negative for irritation, redness and visual disturbance Ears, nose, mouth, throat, and face: negative for hearing loss, nasal congestion, snoring and tinnitus Respiratory: negative for cough, sputum Cardiovascular: negative for chest pain, dyspnea, exertional chest pressure/discomfort, irregular heart beat, palpitations and syncope Gastrointestinal: negative for abdominal pain, change in bowel habits, nausea and vomiting Genitourinary: negative for abnormal menstrual periods, genital lesions, sexual problems and vaginal discharge, dysuria and urinary incontinence Integument/breast: negative for breast lump, breast tenderness and nipple discharge Hematologic/lymphatic: negative for bleeding and easy bruising Musculoskeletal:negative for back pain and muscle weakness Neurological: negative for dizziness, headaches, vertigo and weakness Endocrine: negative for diabetic symptoms including polydipsia, polyuria and skin dryness Allergic/Immunologic: negative for hay fever and urticaria        Objective:  Blood pressure 118/85, pulse 86, height 5\' 3"  (1.6 m), weight 184 lb 8 oz (83.7 kg), last menstrual period 11/19/2020.  Body mass index is 32.68 kg/m.  General Appearance:    Alert, cooperative, no distress, appears stated age, mildly obese  Head:    Normocephalic, without obvious abnormality, atraumatic  Eyes:    PERRL, conjunctiva/corneas clear, EOM's intact, both eyes  Ears:    Normal external ear canals, both ears  Nose:   Nares normal, septum midline, mucosa normal, no drainage or sinus tenderness  Throat:   Lips, mucosa, and tongue normal; teeth and gums normal  Neck:   Supple, symmetrical, trachea midline, no adenopathy; thyroid: no enlargement/tenderness/nodules; no carotid bruit or JVD  Back:     Symmetric, no curvature, ROM normal, no CVA tenderness  Lungs:     Clear to  auscultation bilaterally, respirations unlabored  Chest Wall:    No tenderness or deformity   Heart:    Regular rate and rhythm, S1 and S2 normal, no murmur, rub or gallop  Breast Exam:    No tenderness, masses, or nipple abnormality  Abdomen:     Soft, non-tender, bowel sounds active all four quadrants, no masses, no organomegaly.    Genitalia:    Pelvic:external genitalia normal, vagina without lesions, discharge, or tenderness, rectovaginal septum  normal. Cervix normal in appearance, no cervical motion tenderness, no adnexal masses or tenderness.  Uterus normal size, shape, mobile, regular contours, nontender.  Rectal:    Normal external sphincter.  No hemorrhoids appreciated. Internal exam not done.   Extremities:   Extremities normal, atraumatic, no cyanosis or edema  Pulses:   2+ and  symmetric all extremities  Skin:   Skin color, texture, turgor normal, no rashes or lesions  Lymph nodes:   Cervical, supraclavicular, and axillary nodes normal  Neurologic:   CNII-XII intact, normal strength, sensation and reflexes throughout   .  Labs:  Lab Results  Component Value Date   WBC 6.5 05/01/2020   HGB 14.0 05/01/2020   HCT 41.2 05/01/2020   MCV 87 05/01/2020   PLT 190 05/01/2020    Lab Results  Component Value Date   CREATININE 0.75 05/01/2020   BUN 9 05/01/2020   NA 140 05/01/2020   K 4.4 05/01/2020   CL 102 05/01/2020   CO2 25 05/01/2020    Lab Results  Component Value Date   ALT 118 (H) 05/15/2020   AST 52 (H) 05/15/2020   ALKPHOS 93 05/15/2020   BILITOT 0.6 05/15/2020    Lab Results  Component Value Date   TSH 3.320 05/01/2020     Assessment:   1. Encounter for well woman exam with routine gynecological exam   2. Pap smear for cervical cancer screening   3. Infertility associated with anovulation   4. Acquired hypothyroidism   5. Obesity (BMI 30.0-34.9)     Plan:   - Blood tests: None ordered. Had labs performed by PCP in December, also had labs  performed at [facility]. - Breast self exam technique reviewed and patient encouraged to perform self-exam monthly. - Discussed healthy lifestyle modifications.  - Pap ~ 2-3 years ago. Will perform today - Contraception: none - patient is attempting to conceive. Currently on second round of Clomid.  - Hypothyroidism, currently on Synthroid. Stable. - Follow up in 1 year for annual exam. To f/u sooner if pregnancy occurs. Encourage PNV.   Hildred Laser, MD [facility]

## 2020-12-09 NOTE — Patient Instructions (Signed)
Breast Self-Awareness Breast self-awareness is knowing how your breasts look and feel. Doing breast self-awareness is important. It allows you to catch a breast problem early while it is still small and can be treated. All women should do breast self-awareness, including women who have had breast implants. Tell your doctorif you notice a change in your breasts. What you need: A mirror. A well-lit room. How to do a breast self-exam A breast self-exam is one way to learn what is normal for your breasts and tocheck for changes. To do a breast self-exam: Look for changes  Take off all the clothes above your waist. Stand in front of a mirror in a room with good lighting. Put your hands on your hips. Push your hands down. Look at your breasts and nipples in the mirror to see if one breast or nipple looks different from the other. Check to see if: The shape of one breast is different. The size of one breast is different. There are wrinkles, dips, and bumps in one breast and not the other. Look at each breast for changes in the skin, such as: Redness. Scaly areas. Look for changes in your nipples, such as: Liquid around the nipples. Bleeding. Dimpling. Redness. A change in where the nipples are.  Feel for changes  Lie on your back on the floor. Feel each breast. To do this, follow these steps: Pick a breast to feel. Put the arm closest to that breast above your head. Use your other arm to feel the nipple area of your breast. Feel the area with the pads of your three middle fingers by making small circles with your fingers. For the first circle, press lightly. For the second circle, press harder. For the third circle, press even harder. Keep making circles with your fingers at the different pressures as you move down your breast. Stop when you feel your ribs. Move your fingers a little toward the center of your body. Start making circles with your fingers again, this time going up until  you reach your collarbone. Keep making up-and-down circles until you reach your armpit. Remember to keep using the three pressures. Feel the other breast in the same way. Sit or stand in the tub or shower. With soapy water on your skin, feel each breast the same way you did in step 2 when you were lying on the floor.  Write down what you find Writing down what you find can help you remember what to tell your doctor. Write down: What is normal for each breast. Any changes you find in each breast, including: The kind of changes you find. Whether you have pain. Size and location of any lumps. When you last had your menstrual period. General tips Check your breasts every month. If you are breastfeeding, the best time to check your breasts is after you feed your baby or after you use a breast pump. If you get menstrual periods, the best time to check your breasts is 5-7 days after your menstrual period is over. With time, you will become comfortable with the self-exam, and you will begin to know if there are changes in your breasts. Contact a doctor if you: See a change in the shape or size of your breasts or nipples. See a change in the skin of your breast or nipples, such as red or scaly skin. Have fluid coming from your nipples that is not normal. Find a lump or thick area that was not there before. Have pain in   your breasts. Have any concerns about your breast health. Summary Breast self-awareness includes looking for changes in your breasts, as well as feeling for changes within your breasts. Breast self-awareness should be done in front of a mirror in a well-lit room. You should check your breasts every month. If you get menstrual periods, the best time to check your breasts is 5-7 days after your menstrual period is over. Let your doctor know of any changes you see in your breasts, including changes in size, changes on the skin, pain or tenderness, or fluid from your nipples that is  not normal. This information is not intended to replace advice given to you by your health care provider. Make sure you discuss any questions you have with your healthcare provider. Document Revised: 12/20/2017 Document Reviewed: 12/20/2017 Elsevier Patient Education  2022 Elsevier Inc.      Preventive Care 63-72 Years Old, Female Preventive care refers to lifestyle choices and visits with your health care provider that can promote health and wellness. This includes: A yearly physical exam. This is also called an annual wellness visit. Regular dental and eye exams. Immunizations. Screening for certain conditions. Healthy lifestyle choices, such as: Eating a healthy diet. Getting regular exercise. Not using drugs or products that contain nicotine and tobacco. Limiting alcohol use. What can I expect for my preventive care visit? Physical exam Your health care provider will check your: Height and weight. These may be used to calculate your BMI (body mass index). BMI is a measurement that tells if you are at a healthy weight. Heart rate and blood pressure. Body temperature. Skin for abnormal spots. Counseling Your health care provider may ask you questions about your: Past medical problems. Family's medical history. Alcohol, tobacco, and drug use. Emotional well-being. Home life and relationship well-being. Sexual activity. Diet, exercise, and sleep habits. Work and work Statistician. Access to firearms. Method of birth control. Menstrual cycle. Pregnancy history. What immunizations do I need?  Vaccines are usually given at various ages, according to a schedule. Your health care provider will recommend vaccines for you based on your age, medicalhistory, and lifestyle or other factors, such as travel or where you work. What tests do I need? Blood tests Lipid and cholesterol levels. These may be checked every 5 years, or more often if you are over 65 years old. Hepatitis C  test. Hepatitis B test. Screening Lung cancer screening. You may have this screening every year starting at age 9 if you have a 30-pack-year history of smoking and currently smoke or have quit within the past 15 years. Colorectal cancer screening. All adults should have this screening starting at age 1 and continuing until age 20. Your health care provider may recommend screening at age 68 if you are at increased risk. You will have tests every 1-10 years, depending on your results and the type of screening test. Diabetes screening. This is done by checking your blood sugar (glucose) after you have not eaten for a while (fasting). You may have this done every 1-3 years. Mammogram. This may be done every 1-2 years. Talk with your health care provider about when you should start having regular mammograms. This may depend on whether you have a family history of breast cancer. BRCA-related cancer screening. This may be done if you have a family history of breast, ovarian, tubal, or peritoneal cancers. Pelvic exam and Pap test. This may be done every 3 years starting at age 15. Starting at age 36, this may be done  5 years if you have a Pap test in combination with an HPV test. Other tests STD (sexually transmitted disease) testing, if you are at risk. Bone density scan. This is done to screen for osteoporosis. You may have this scan if you are at high risk for osteoporosis. Talk with your health care provider about your test results, treatment options,and if necessary, the need for more tests. Follow these instructions at home: Eating and drinking  Eat a diet that includes fresh fruits and vegetables, whole grains, lean protein, and low-fat dairy products. Take vitamin and mineral supplements as recommended by your health care provider. Do not drink alcohol if: Your health care provider tells you not to drink. You are pregnant, may be pregnant, or are planning to become pregnant. If  you drink alcohol: Limit how much you have to 0-1 drink a day. Be aware of how much alcohol is in your drink. In the [place] one drink equals one 12 oz bottle of beer (355 mL), one 5 oz glass of wine (148 mL), or one 1 oz glass of hard liquor (44 mL).  Lifestyle Take daily care of your teeth and gums. Brush your teeth every morning and night with fluoride toothpaste. Floss one time each day. Stay active. Exercise for at least 30 minutes 5 or more days each week. Do not use any products that contain nicotine or tobacco, such as cigarettes, e-cigarettes, and chewing tobacco. If you need help quitting, ask your health care provider. Do not use drugs. If you are sexually active, practice safe sex. Use a condom or other form of protection to prevent STIs (sexually transmitted infections). If you do not wish to become pregnant, use a form of birth control. If you plan to become pregnant, see your health care provider for a prepregnancy visit. If told by your health care provider, take low-dose aspirin daily starting at age 50. Find healthy ways to cope with stress, such as: Meditation, yoga, or listening to music. Journaling. Talking to a trusted person. Spending time with friends and family. Safety Always wear your seat belt while driving or riding in a vehicle. Do not drive: If you have been drinking alcohol. Do not ride with someone who has been drinking. When you are tired or distracted. While texting. Wear a helmet and other protective equipment during sports activities. If you have firearms in your house, make sure you follow all gun safety procedures. What's next? Visit your health care provider once a year for an annual wellness visit. Ask your health care provider how often you should have your eyes and teeth checked. Stay up to date on all vaccines. This information is not intended to replace advice given to you by your health care provider. Make sure you discuss any questions you  have with your healthcare provider. Document Revised: 02/05/2020 Document Reviewed: 01/12/2018 Elsevier Patient Education  2022 Elsevier Inc.  

## 2020-12-10 ENCOUNTER — Other Ambulatory Visit: Payer: Self-pay

## 2020-12-10 ENCOUNTER — Other Ambulatory Visit (HOSPITAL_COMMUNITY)
Admission: RE | Admit: 2020-12-10 | Discharge: 2020-12-10 | Disposition: A | Discharge status: Home / Self Care | Payer: 59 | Source: Ambulatory Visit | Attending: Obstetrics and Gynecology | Admitting: Obstetrics and Gynecology

## 2020-12-10 ENCOUNTER — Encounter: Payer: Self-pay | Admitting: Obstetrics and Gynecology

## 2020-12-10 ENCOUNTER — Ambulatory Visit (INDEPENDENT_AMBULATORY_CARE_PROVIDER_SITE_OTHER): Payer: 59 | Admitting: Obstetrics and Gynecology

## 2020-12-10 VITALS — BP 118/85 | HR 86 | Ht 63.0 in | Wt 184.5 lb

## 2020-12-10 DIAGNOSIS — E039 Hypothyroidism, unspecified: Secondary | ICD-10-CM | POA: Insufficient documentation

## 2020-12-10 DIAGNOSIS — Z01419 Encounter for gynecological examination (general) (routine) without abnormal findings: Secondary | ICD-10-CM

## 2020-12-10 DIAGNOSIS — N970 Female infertility associated with anovulation: Secondary | ICD-10-CM | POA: Diagnosis not present

## 2020-12-10 DIAGNOSIS — Z124 Encounter for screening for malignant neoplasm of cervix: Secondary | ICD-10-CM | POA: Diagnosis present

## 2020-12-10 DIAGNOSIS — K760 Fatty (change of) liver, not elsewhere classified: Secondary | ICD-10-CM

## 2020-12-10 DIAGNOSIS — E669 Obesity, unspecified: Secondary | ICD-10-CM

## 2020-12-10 HISTORY — DX: Hypothyroidism, unspecified: E03.9

## 2020-12-10 HISTORY — DX: Fatty (change of) liver, not elsewhere classified: K76.0

## 2020-12-15 DIAGNOSIS — N8110 Cystocele, unspecified: Secondary | ICD-10-CM

## 2020-12-16 LAB — CYTOLOGY - PAP
Comment: NEGATIVE
Diagnosis: UNDETERMINED — AB
High risk HPV: NEGATIVE

## 2021-01-15 LAB — TSH: TSH: 2.45 (ref <=5.90)

## 2021-01-29 ENCOUNTER — Ambulatory Visit: Payer: 59 | Admitting: Family Medicine

## 2021-01-29 ENCOUNTER — Other Ambulatory Visit: Payer: Self-pay

## 2021-01-29 ENCOUNTER — Encounter: Payer: Self-pay | Admitting: Family Medicine

## 2021-01-29 VITALS — BP 115/81 | HR 94 | Temp 98.1°F | Ht 64.0 in | Wt 186.5 lb

## 2021-01-29 DIAGNOSIS — F5104 Psychophysiologic insomnia: Secondary | ICD-10-CM

## 2021-01-29 DIAGNOSIS — E039 Hypothyroidism, unspecified: Secondary | ICD-10-CM | POA: Diagnosis not present

## 2021-01-29 DIAGNOSIS — Z85850 Personal history of malignant neoplasm of thyroid: Secondary | ICD-10-CM | POA: Insufficient documentation

## 2021-01-29 DIAGNOSIS — E042 Nontoxic multinodular goiter: Secondary | ICD-10-CM | POA: Insufficient documentation

## 2021-01-29 DIAGNOSIS — F419 Anxiety disorder, unspecified: Secondary | ICD-10-CM | POA: Diagnosis not present

## 2021-01-29 DIAGNOSIS — Z23 Encounter for immunization: Secondary | ICD-10-CM

## 2021-01-29 DIAGNOSIS — Z6832 Body mass index (BMI) 32.0-32.9, adult: Secondary | ICD-10-CM

## 2021-01-29 DIAGNOSIS — K760 Fatty (change of) liver, not elsewhere classified: Secondary | ICD-10-CM

## 2021-01-29 DIAGNOSIS — E669 Obesity, unspecified: Secondary | ICD-10-CM | POA: Insufficient documentation

## 2021-01-29 MED ORDER — TRAZODONE HCL 100 MG PO TABS: 200.0000 mg | ORAL_TABLET | Freq: Every evening | ORAL | 1 refills | Status: DC | PRN

## 2021-01-29 NOTE — Assessment & Plan Note (Signed)
-   Multiple small thyroid nodules noted on U/S in June 2022 - Will continue to monitor; due for repeat U/S following next visit in 6 mos

## 2021-01-29 NOTE — Assessment & Plan Note (Signed)
-   Chronic and well-controlled - Continue Hydroxyzine prn for anxiety attacks 

## 2021-01-29 NOTE — Assessment & Plan Note (Signed)
-   Chronic, previously stable - Most recent TSH WNL earlier this month

## 2021-01-29 NOTE — Assessment & Plan Note (Signed)
-   Chronic, previously stable - Recheck CMP 

## 2021-01-29 NOTE — Progress Notes (Signed)
Established patient visit   Patient: Dawn Mejia   DOB: Mar 31, 1981   40 y.o. Female  MRN: [id number] Visit Date: 01/29/2021  Today's healthcare provider: Shirlee Latch, MD   Chief Complaint  Patient presents with   Follow-up   Subjective    HPI  Hypothyroidism - Currently taking Synthroid; denies side effects - Denies cold intolerance, fatigue, & constipation  Anxiety - Pt. reports that anxiety has greatly improved since last visit - States that she takes Hydroxyzine 1x/month prn for anxiety - States that she stopped taking Zoloft a few months ago; denies complications since discontinuing it  Insomnia - Currently taking Trazodone - Pt. feels that Trazodone has been less effective in recent months  Asthma - Followed by pulmonology - Uses Albuterol prn for wheezing, but states that she hasn't needed it in several mos  Health Maintenance - Due for Tetanus booster   Medications: Outpatient Medications Prior to Visit  Medication Sig   albuterol (PROVENTIL HFA;VENTOLIN HFA) 108 (90 Base) MCG/ACT inhaler Inhale into the lungs every 6 (six) hours as needed for wheezing or shortness of breath.   hydrOXYzine (ATARAX/VISTARIL) 10 MG tablet Take 1 tablet (10 mg total) by mouth 3 (three) times daily as needed.   levothyroxine (SYNTHROID) 25 MCG tablet Take 25 mcg by mouth daily before breakfast. Take 1 tablet (25 mcg total) by mouth once daily Take on an empty stomach with a glass of water at least 30-60 minutes before breakfast.   montelukast (SINGULAIR) 10 MG tablet Take 10 mg by mouth at bedtime.   pantoprazole (PROTONIX) 40 MG tablet Take by mouth.   Prenatal Vit-Fe Fumarate-FA (PRENATAL VITAMIN PO) Take by mouth.   [DISCONTINUED] sertraline (ZOLOFT) 50 MG tablet Take 1 tablet (50 mg total) by mouth daily. (Patient taking differently: Take 50 mg by mouth daily. As needed)   [DISCONTINUED] traZODone (DESYREL) 150 MG tablet Take 1 tablet (150 mg total) by mouth at  bedtime as needed for sleep.   clomiPHENE (CLOMID) 50 MG tablet Take by mouth daily. Days 3-7 of cycle (Patient not taking: Reported on 01/29/2021)   No facility-administered medications prior to visit.    Review of Systems  Constitutional:  Negative for activity change, appetite change, fatigue and fever.  HENT: Negative.    Eyes: Negative.   Respiratory:  Negative for chest tightness and shortness of breath.   Cardiovascular:  Negative for chest pain and leg swelling.  Gastrointestinal: Negative.   Endocrine: Negative.   Genitourinary: Negative.   Musculoskeletal: Negative.   Skin: Negative.   Neurological: Negative.   Psychiatric/Behavioral:  Positive for sleep disturbance.       Objective    BP 115/81 (BP Location: Left Arm, Patient Position: Sitting, Cuff Size: Large)   Pulse 94   Temp 98.1 F (36.7 C) (Oral)   Ht 5\' 4"  (1.626 m)   Wt 186 lb 8 oz (84.6 kg)   BMI 32.01 kg/m  {Show previous vital signs (optional):23777}   Physical Exam Constitutional:      General: She is not in acute distress.    Appearance: Normal appearance. She is obese.  HENT:     Head: Normocephalic and atraumatic.     Right Ear: External ear normal.     Left Ear: External ear normal.  Eyes:     Conjunctiva/sclera: Conjunctivae normal.  Cardiovascular:     Rate and Rhythm: Normal rate and regular rhythm.     Pulses: Normal pulses.  Heart sounds: Normal heart sounds.  Pulmonary:     Effort: Pulmonary effort is normal.     Breath sounds: Normal breath sounds.  Abdominal:     General: Bowel sounds are normal.     Palpations: Abdomen is soft.  Skin:    General: Skin is warm and dry.  Neurological:     Mental Status: She is alert.  Psychiatric:        Behavior: Behavior normal.        Thought Content: Thought content normal.     Results for orders placed or performed in visit on 01/29/21  Hemoglobin A1c  Result Value Ref Range   Hemoglobin A1C 5.4   TSH  Result Value Ref Range    TSH 2.45 0.41 - 5.90    Assessment & Plan      Problem List Items Addressed This Visit       Digestive   Fatty liver disease, nonalcoholic    - Chronic, previously stable - Recheck CMP      Relevant Orders   Comprehensive metabolic panel   Lipid panel     Endocrine   Acquired hypothyroidism    - Chronic, previously stable - Continue Synthroid - Most recent TSH WNL earlier this month      Multiple thyroid nodules    - Multiple small thyroid nodules noted on U/S in June 2022 - Will continue to monitor; due for repeat U/S following next visit in 6 mos        Other   Insomnia    - Chronic, poorly controlled - Increase Trazodone to 200 mg qhs prn for sleep      Anxiety - Primary    - Chronic and well-controlled - Continue Hydroxyzine prn for anxiety attacks      Relevant Medications   traZODone (DESYREL) 100 MG tablet   Class 1 obesity without serious comorbidity with body mass index (BMI) of 32.0 to 32.9 in adult    - Chronic and stable - Discussed diet and exercise - Discussed importance of healthy weight management      Relevant Orders   Comprehensive metabolic panel   Lipid panel     Need for Tdap vaccination   - Tdap booster delivered in office today    Need for influenza vaccination   - Flu vaccine delivered in office today        No follow-ups on file.      Queen Blossom, MS3  Patient seen along with MS3 student Queen Blossom. I personally evaluated this patient along with the student, and verified all aspects of the history, physical exam, and medical decision making as documented by the student. I agree with the student's documentation and have made all necessary edits.  Kathreen Dileo, Marzella Schlein, MD, MPH [facility]

## 2021-01-29 NOTE — Assessment & Plan Note (Signed)
-   Chronic and stable - Discussed diet and exercise - Discussed importance of healthy weight management 

## 2021-01-29 NOTE — Assessment & Plan Note (Signed)
-   Chronic, poorly controlled - Increase Trazodone to 200 mg qhs prn for sleep

## 2021-01-30 LAB — COMPREHENSIVE METABOLIC PANEL
ALT: 133 IU/L — ABNORMAL HIGH (ref 0–32)
AST: 58 IU/L — ABNORMAL HIGH (ref 0–40)
Albumin/Globulin Ratio: 2.1 (ref 1.2–2.2)
Albumin: 4.6 g/dL (ref 3.8–4.8)
Alkaline Phosphatase: 111 IU/L (ref 44–121)
BUN/Creatinine Ratio: 15 (ref 9–23)
BUN: 15 mg/dL (ref 6–24)
Bilirubin Total: 0.4 mg/dL (ref 0.0–1.2)
CO2: 26 mmol/L (ref 20–29)
Calcium: 10.1 mg/dL (ref 8.7–10.2)
Chloride: 102 mmol/L (ref 96–106)
Creatinine, Ser: 1.03 mg/dL — ABNORMAL HIGH (ref 0.57–1.00)
Globulin, Total: 2.2 g/dL (ref 1.5–4.5)
Glucose: 118 mg/dL — ABNORMAL HIGH (ref 65–99)
Potassium: 4.5 mmol/L (ref 3.5–5.2)
Sodium: 140 mmol/L (ref 134–144)
Total Protein: 6.8 g/dL (ref 6.0–8.5)
eGFR: 70 mL/min/{1.73_m2} (ref >59)

## 2021-01-30 LAB — LIPID PANEL
Chol/HDL Ratio: 4.9 ratio — ABNORMAL HIGH (ref 0.0–4.4)
Cholesterol, Total: 191 mg/dL (ref 100–199)
HDL: 39 mg/dL — ABNORMAL LOW (ref >39)
LDL Chol Calc (NIH): 122 mg/dL — ABNORMAL HIGH (ref 0–99)
Triglycerides: 169 mg/dL — ABNORMAL HIGH (ref 0–149)
VLDL Cholesterol Cal: 30 mg/dL (ref 5–40)

## 2021-02-18 ENCOUNTER — Encounter: Payer: Self-pay | Admitting: Family Medicine

## 2021-04-24 LAB — HM MAMMOGRAPHY

## 2021-07-16 ENCOUNTER — Encounter: Payer: 59 | Admitting: Family Medicine

## 2021-08-11 ENCOUNTER — Encounter: Payer: Self-pay | Admitting: Family Medicine

## 2021-08-11 NOTE — Telephone Encounter (Signed)
Patient aware Dr. B is out till Friday.  ?

## 2021-08-12 ENCOUNTER — Ambulatory Visit: Payer: 59 | Admitting: Physician Assistant

## 2021-08-12 DIAGNOSIS — F5104 Psychophysiologic insomnia: Secondary | ICD-10-CM

## 2021-08-12 MED ORDER — TRAZODONE HCL 100 MG PO TABS: 200.0000 mg | ORAL_TABLET | Freq: Every evening | ORAL | 1 refills | Status: DC | PRN

## 2021-08-12 NOTE — Progress Notes (Signed)
?I,Sha'taria Tyson,acting as a Education administrator for [employer], PA-C.,have documented all relevant documentation on the behalf of Mikey Kirschner, PA-C,as directed by  Mikey Kirschner, PA-C while in the presence of Mikey Kirschner, PA-C. ? ?Established Patient Office Visit ? ?Subjective:  ?Patient ID: Dawn Mejia, female    DOB: 1981/03/25  Age: 41 y.o. MRN: [id number] ? ? ?HPI ?Hector Brunswick presents for FMLA forms for spousal support. Her husband is starting a 30 day in patient substance abuse program.She has been out of work since 08/05/21 and needs to stay out until 08/18/21. Over the next 6 months she is unsure if she will need additional time off. ? ?She reports needing a refill on trazodone. ? ?Past Medical History:  ?Diagnosis Date  ? Acquired hypothyroidism 12/10/2020  ? Allergy   ? Anxiety   ? Asthma   ? Fatty liver disease, nonalcoholic 4/92/0100  ? Thyroid disease   ? ? ?Past Surgical History:  ?Procedure Laterality Date  ? BREAST ENHANCEMENT SURGERY    ? CHOLECYSTECTOMY    ? ROTATOR CUFF REPAIR Left 2020  ? WISDOM TOOTH EXTRACTION    ? ? ?Family History  ?Problem Relation Age of Onset  ? Diabetes Mother   ? Arthritis Mother   ? Hypertension Father   ? Asthma Father   ? COPD Father   ?     smoker  ? Asthma Sister   ? Allergies Sister   ? Thyroid disease Sister   ? Asthma Daughter   ? Allergic Disorder Daughter   ? Healthy Sister   ? Healthy Daughter   ? Colon cancer Maternal Uncle 4  ? Breast cancer Neg Hx   ? ? ?Social History  ? ?Socioeconomic History  ? Marital status: Married  ?  Spouse name: Not on file  ? Number of children: 2  ? Years of education: Not on file  ? Highest education level: Not on file  ?Occupational History  ? Occupation: Art therapist  ?Tobacco Use  ? Smoking status: Never  ? Smokeless tobacco: Never  ?Vaping Use  ? Vaping Use: Every day  ? Substances: Nicotine, Flavoring  ?Substance and Sexual Activity  ? Alcohol use: Yes  ?  Alcohol/week: 0.0 - 2.0 standard drinks  ?  Comment:  occasional  ? Drug use: No  ? Sexual activity: Yes  ?  Partners: Male  ?  Birth control/protection: None  ?Other Topics Concern  ? Not on file  ?Social History Narrative  ? Not on file  ? ?Social Determinants of Health  ? ?Financial Resource Strain: Not on file  ?Food Insecurity: Not on file  ?Transportation Needs: Not on file  ?Physical Activity: Not on file  ?Stress: Not on file  ?Social Connections: Not on file  ?Intimate Partner Violence: Not on file  ? ? ?Outpatient Medications Prior to Visit  ?Medication Sig Dispense Refill  ? albuterol (PROVENTIL HFA;VENTOLIN HFA) 108 (90 Base) MCG/ACT inhaler Inhale into the lungs every 6 (six) hours as needed for wheezing or shortness of breath.    ? clomiPHENE (CLOMID) 50 MG tablet Take by mouth daily. Days 3-7 of cycle (Patient not taking: Reported on 01/29/2021)    ? hydrOXYzine (ATARAX/VISTARIL) 10 MG tablet Take 1 tablet (10 mg total) by mouth 3 (three) times daily as needed. 90 tablet 1  ? levothyroxine (SYNTHROID) 25 MCG tablet Take 25 mcg by mouth daily before breakfast. Take 1 tablet (25 mcg total) by mouth once daily Take on an empty stomach with  a glass of water at least 30-60 minutes before breakfast.    ? montelukast (SINGULAIR) 10 MG tablet Take 10 mg by mouth at bedtime.    ? pantoprazole (PROTONIX) 40 MG tablet Take by mouth.    ? Prenatal Vit-Fe Fumarate-FA (PRENATAL VITAMIN PO) Take by mouth.    ? traZODone (DESYREL) 100 MG tablet Take 2 tablets (200 mg total) by mouth at bedtime as needed for sleep. 180 tablet 1  ? ?No facility-administered medications prior to visit.  ? ? ?Allergies  ?Allergen Reactions  ? Shellfish-Derived Products Anaphylaxis  ? Shellfish Allergy   ? Tramadol Itching  ?  welts  ? ? ?ROS ?Review of Systems  ?Constitutional:  Negative for fatigue and fever.  ?Respiratory:  Negative for cough and shortness of breath.   ?Cardiovascular:  Negative for chest pain and leg swelling.  ?Gastrointestinal:  Negative for abdominal pain.   ?Neurological:  Negative for dizziness and headaches.  ?Psychiatric/Behavioral:  Positive for sleep disturbance. The patient is nervous/anxious.   ? ?  ?Objective:  ?  ?Physical Exam ?Constitutional:   ?   Appearance: Normal appearance. She is not ill-appearing.  ?HENT:  ?   Head: Normocephalic.  ?Eyes:  ?   Conjunctiva/sclera: Conjunctivae normal.  ?Cardiovascular:  ?   Rate and Rhythm: Normal rate and regular rhythm.  ?   Pulses: Normal pulses.  ?   Heart sounds: Normal heart sounds.  ?Pulmonary:  ?   Effort: Pulmonary effort is normal.  ?   Breath sounds: Normal breath sounds.  ?Neurological:  ?   Mental Status: She is oriented to person, place, and time.  ?Psychiatric:     ?   Mood and Affect: Mood normal.     ?   Behavior: Behavior normal.  ? ? ?There were no vitals taken for this visit. ?Wt Readings from Last 3 Encounters:  ?01/29/21 186 lb 8 oz (84.6 kg)  ?12/10/20 184 lb 8 oz (83.7 kg)  ?08/01/20 184 lb (83.5 kg)  ? ? ? ?Health Maintenance Due  ?Topic Date Due  ? COVID-19 Vaccine (4 - Booster for Pfizer series) 08/25/2020  ? ? ?There are no preventive care reminders to display for this patient. ? ?Lab Results  ?Component Value Date  ? TSH 2.45 01/15/2021  ? ?Lab Results  ?Component Value Date  ? WBC 6.5 05/01/2020  ? HGB 14.0 05/01/2020  ? HCT 41.2 05/01/2020  ? MCV 87 05/01/2020  ? PLT 190 05/01/2020  ? ?Lab Results  ?Component Value Date  ? NA 140 01/29/2021  ? K 4.5 01/29/2021  ? CO2 26 01/29/2021  ? GLUCOSE 118 (H) 01/29/2021  ? BUN 15 01/29/2021  ? CREATININE 1.03 (H) 01/29/2021  ? BILITOT 0.4 01/29/2021  ? ALKPHOS 111 01/29/2021  ? AST 58 (H) 01/29/2021  ? ALT 133 (H) 01/29/2021  ? PROT 6.8 01/29/2021  ? ALBUMIN 4.6 01/29/2021  ? CALCIUM 10.1 01/29/2021  ? EGFR 70 01/29/2021  ? ?Lab Results  ?Component Value Date  ? CHOL 191 01/29/2021  ? ?Lab Results  ?Component Value Date  ? HDL 39 (L) 01/29/2021  ? ?Lab Results  ?Component Value Date  ? LDLCALC 122 (H) 01/29/2021  ? ?Lab Results  ?Component Value  Date  ? TRIG 169 (H) 01/29/2021  ? ?Lab Results  ?Component Value Date  ? CHOLHDL 4.9 (H) 01/29/2021  ? ?Lab Results  ?Component Value Date  ? HGBA1C 5.4 10/10/2020  ? ? ?  ?Assessment & Plan:  ? ?FMLA  paperwork ?-from 08/05/21 until 08/18/21.  ?-intermittent from 08/18/21 - 6 months. Allowance for 1-2 days a week, all day, prn spousal support needed.  ? ?Insomnia ?-refilled trazodone.  ? ?Pt admits to increased anxiety w/ ongoing changes, will try to schedule her w/ pcp. ? ?I, Mikey Kirschner, PA-C have reviewed all documentation for this visit. The documentation on  08/12/2021 for the exam, diagnosis, procedures, and orders are all accurate and complete. ? ?Mikey Kirschner, PA-C ?[place] ?[address] ?[place], [place], [address] ?Office: [phone] ?Fax: [phone]  ?

## 2021-08-13 ENCOUNTER — Telehealth: Payer: Self-pay | Admitting: Family Medicine

## 2021-08-13 ENCOUNTER — Encounter: Payer: Self-pay | Admitting: Physician Assistant

## 2021-08-13 NOTE — Telephone Encounter (Signed)
Copied from [place] [id number]. Topic: General - Other ?>> Aug 13, 2021 11:08 AM Leward Quan A wrote: ?Reason for CRM: Patient called in to inform Mikey Kirschner of some dates for [employer] paperwork 09/07/21 through 09/11/21 potentially. Patient can be reached at Ph# [phone] ?

## 2021-08-14 NOTE — Telephone Encounter (Signed)
She can drop off FMLA forms and we will complete.  Surely we have a time next week where she could do a virtual visit to discuss anxiety med options. Can you find her an appt? Thanks!

## 2021-08-18 ENCOUNTER — Ambulatory Visit: Payer: 59 | Admitting: Family Medicine

## 2021-08-18 ENCOUNTER — Encounter: Payer: Self-pay | Admitting: Family Medicine

## 2021-08-18 VITALS — BP 118/90 | HR 105 | Temp 98.2°F | Resp 16 | Wt 182.0 lb

## 2021-08-18 DIAGNOSIS — F419 Anxiety disorder, unspecified: Secondary | ICD-10-CM | POA: Diagnosis not present

## 2021-08-18 DIAGNOSIS — F5104 Psychophysiologic insomnia: Secondary | ICD-10-CM

## 2021-08-18 DIAGNOSIS — F4323 Adjustment disorder with mixed anxiety and depressed mood: Secondary | ICD-10-CM | POA: Diagnosis not present

## 2021-08-18 DIAGNOSIS — F4310 Post-traumatic stress disorder, unspecified: Secondary | ICD-10-CM | POA: Diagnosis not present

## 2021-08-18 DIAGNOSIS — N970 Female infertility associated with anovulation: Secondary | ICD-10-CM

## 2021-08-18 MED ORDER — ALPRAZOLAM 0.5 MG PO TABS: 0.2500 mg | ORAL_TABLET | Freq: Two times a day (BID) | ORAL | 0 refills | Status: DC | PRN

## 2021-08-18 MED ORDER — SERTRALINE HCL 50 MG PO TABS: 50.0000 mg | ORAL_TABLET | Freq: Every day | ORAL | 3 refills | Status: DC

## 2021-08-18 NOTE — Assessment & Plan Note (Signed)
Chronic and uncontrolled ?Related to husband's drug use and finding him unresponsive multiple times ?Continue working with therapist ?Resume Zoloft as below ?Low-dose Xanax to use sparingly as needed for panic symptoms ?

## 2021-08-18 NOTE — Assessment & Plan Note (Signed)
Chronic and uncontrolled ?With panic symptoms and PTSD symptoms as above related to husband's previous OD ?We will resume Zoloft 50 mg daily ?Continue therapy ?Xanax to use sparingly for panic symptoms-do not plan for this to be a long-term medication ?Hydroxyzine has not been helpful, so we will discontinue ?

## 2021-08-18 NOTE — Assessment & Plan Note (Signed)
She is grieving missing a cycle of IUI as husband is in rehab ?Discussed considering freezing some eggs ?

## 2021-08-18 NOTE — Progress Notes (Signed)
?  ? ?I,Sulibeya S Dimas,acting as a scribe for Shirlee Latch, MD.,have documented all relevant documentation on the behalf of Shirlee Latch, MD,as directed by  Shirlee Latch, MD while in the presence of Shirlee Latch, MD. ? ? ?Established patient visit ? ? ?Patient: Dawn Mejia   DOB: [birth year]   41 y.o. Female  MRN: [id number] ?Visit Date: 08/18/2021 ? ?Today's healthcare provider: Shirlee Latch, MD  ? ?Chief Complaint  ?Patient presents with  ? Anxiety  ? ?Subjective  ?  ?HPI  ?Anxiety, Follow-up ? ?She was last seen for anxiety 6 months ago. ?Changes made at last visit include continue hydroxyzine prn. ?  ?She reports fair compliance with treatment. Patient reports she has not refilled medication for a while. She did have some left from last refill and took some in the last few days.  ?She reports fair tolerance of treatment. ?She is not having side effects.  ? ?She feels her anxiety is severe and Worse since last visit. ? ?More stress related to husband with another accidental OD.  He is currently in inpatient rehab facility x4 wks. May consider an additional longer program. ? ?Symptoms: ?No chest pain Yes difficulty concentrating  ?No dizziness Yes fatigue  ?Yes feelings of losing control Yes insomnia  ?Yes irritable Yes palpitations  ?Yes panic attacks Yes racing thoughts  ?Yes shortness of breath Yes sweating  ?Yes tremors/shakes   ? ?GAD-7 Results ? ?  08/18/2021  ? 11:02 AM 08/01/2020  ?  8:48 AM 05/01/2020  ?  9:05 AM  ?GAD-7 Generalized Anxiety Disorder Screening Tool  ?1. Feeling Nervous, Anxious, or on Edge 3 1 0  ?2. Not Being Able to Stop or Control Worrying 1 0 0  ?3. Worrying Too Much About Different Things 1 0 0  ?4. Trouble Relaxing 1 1 1   ?5. Being So Restless it's Hard To Sit Still 1 0 3  ?6. Becoming Easily Annoyed or Irritable 3 0 0  ?7. Feeling Afraid As If Something Awful Might Happen 0 0 0  ?Total GAD-7 Score 10 2 4   ?Difficulty At Work, Home, or Getting  Along With  Others? Very difficult Not difficult at all Not difficult at all  ? ? ?PHQ-9 Scores ? ?  08/18/2021  ? 11:09 AM 01/29/2021  ?  2:45 PM 08/01/2020  ?  8:28 AM  ?PHQ9 SCORE ONLY  ?PHQ-9 Total Score 17 2 2   ? ? ?--------------------------------------------------------------------------------------------------- ? ? ?Medications: ?Outpatient Medications Prior to Visit  ?Medication Sig  ? albuterol (PROVENTIL HFA;VENTOLIN HFA) 108 (90 Base) MCG/ACT inhaler Inhale into the lungs every 6 (six) hours as needed for wheezing or shortness of breath.  ? clomiPHENE (CLOMID) 50 MG tablet Take by mouth daily. Days 3-7 of cycle  ? levothyroxine (SYNTHROID) 25 MCG tablet Take 25 mcg by mouth daily before breakfast. Take 1 tablet (25 mcg total) by mouth once daily Take on an empty stomach with a glass of water at least 30-60 minutes before breakfast.  ? montelukast (SINGULAIR) 10 MG tablet Take 10 mg by mouth at bedtime.  ? Prenatal Vit-Fe Fumarate-FA (PRENATAL VITAMIN PO) Take by mouth.  ? traZODone (DESYREL) 100 MG tablet Take 2 tablets (200 mg total) by mouth at bedtime as needed for sleep.  ? [DISCONTINUED] hydrOXYzine (ATARAX/VISTARIL) 10 MG tablet Take 1 tablet (10 mg total) by mouth 3 (three) times daily as needed.  ? pantoprazole (PROTONIX) 40 MG tablet Take by mouth.  ? ?No facility-administered medications prior to visit.  ? ? ?  Review of Systems per HPI ? ?  ?  Objective  ?  ?BP 118/90 (BP Location: Left Arm, Patient Position: Sitting, Cuff Size: Large)   Pulse (!) 105   Temp 98.2 ?F (36.8 ?C) (Temporal)   Resp 16   Wt 182 lb (82.6 kg)   LMP 08/13/2021 (Exact Date)   BMI 31.24 kg/m?  ?BP Readings from Last 3 Encounters:  ?08/18/21 118/90  ?01/29/21 115/81  ?12/10/20 118/85  ? ?Wt Readings from Last 3 Encounters:  ?08/18/21 182 lb (82.6 kg)  ?01/29/21 186 lb 8 oz (84.6 kg)  ?12/10/20 184 lb 8 oz (83.7 kg)  ? ?  ? ?Physical Exam ?Vitals reviewed.  ?Constitutional:   ?   General: She is not in acute distress. ?   Appearance:  She is well-developed.  ?HENT:  ?   Head: Normocephalic and atraumatic.  ?Eyes:  ?   General: No scleral icterus. ?   Conjunctiva/sclera: Conjunctivae normal.  ?Cardiovascular:  ?   Rate and Rhythm: Normal rate and regular rhythm.  ?Pulmonary:  ?   Effort: Pulmonary effort is normal. No respiratory distress.  ?Skin: ?   General: Skin is warm and dry.  ?   Findings: No rash.  ?Neurological:  ?   Mental Status: She is alert and oriented to person, place, and time.  ?Psychiatric:     ?   Mood and Affect: Mood is anxious and depressed. Affect is tearful.     ?   Behavior: Behavior normal.  ?  ? ? ?No results found for any visits on 08/18/21. ? Assessment & Plan  ?  ? ?Problem List Items Addressed This Visit   ? ?  ? Genitourinary  ? Infertility associated with anovulation  ?  She is grieving missing a cycle of IUI as husband is in rehab ?Discussed considering freezing some eggs ?  ?  ?  ? Other  ? Insomnia  ?  Chronic and poorly controlled ?Suspect that resuming Zoloft and getting anxiety symptoms back to baseline will be helpful in helping her sleep ?For the time being, continue trazodone 200 mg nightly as needed ?Advised not to take Xanax and trazodone together ?  ?  ? Anxiety - Primary  ?  Chronic and uncontrolled ?With panic symptoms and PTSD symptoms as above related to husband's previous OD ?We will resume Zoloft 50 mg daily ?Continue therapy ?Xanax to use sparingly for panic symptoms-do not plan for this to be a long-term medication ?Hydroxyzine has not been helpful, so we will discontinue ?  ?  ? Relevant Medications  ? sertraline (ZOLOFT) 50 MG tablet  ? ALPRAZolam (XANAX) 0.5 MG tablet  ? PTSD (post-traumatic stress disorder)  ?  Chronic and uncontrolled ?Related to husband's drug use and finding him unresponsive multiple times ?Continue working with therapist ?Resume Zoloft as below ?Low-dose Xanax to use sparingly as needed for panic symptoms ?  ?  ? Relevant Medications  ? sertraline (ZOLOFT) 50 MG tablet   ? ALPRAZolam (XANAX) 0.5 MG tablet  ? Adjustment disorder with mixed anxiety and depressed mood  ?  As above, resume Zoloft ?Continue therapy ?May also consider EMDR and brain spotting with therapist ?  ?  ?  ? ?Return in about 6 weeks (around 09/29/2021) for CPE, MDD/GAD f/u, as scheduled.  ?   ? ?Total time spent on today's visit was greater than 40 minutes, including both face-to-face time and nonface-to-face time personally spent on review of chart, discussing goals, discussing treatment  options,  answering patient's questions, and coordinating care.  ? ?I, Shirlee LatchAngela Jibran Crookshanks, MD, have reviewed all documentation for this visit. The documentation on 08/18/21 for the exam, diagnosis, procedures, and orders are all accurate and complete. ? ? ?Erasmo DownerBacigalupo, Sarafina Puthoff M, MD, MPH ?[facility] ?[facility]   ?

## 2021-08-18 NOTE — Assessment & Plan Note (Signed)
Chronic and poorly controlled ?Suspect that resuming Zoloft and getting anxiety symptoms back to baseline will be helpful in helping her sleep ?For the time being, continue trazodone 200 mg nightly as needed ?Advised not to take Xanax and trazodone together ?

## 2021-08-18 NOTE — Patient Instructions (Addendum)
Ask your therapist about EMDR and/or brainspotting techniques ? ?Think about [employer] book/audiobook Cristie Hem) ?

## 2021-08-18 NOTE — Assessment & Plan Note (Signed)
As above, resume Zoloft ?Continue therapy ?May also consider EMDR and brain spotting with therapist ?

## 2021-10-05 NOTE — Progress Notes (Unsigned)
I,Deisi Salonga S Kelia Gibbon,acting as a Education administrator for [name], MD.,have documented all relevant documentation on the behalf of [name], MD,as directed by  [name], MD while in the presence of [name], MD.   Complete physical exam   Patient: Dawn Mejia   DOB: 01-14-1981   41 y.o. Female  MRN: [id number] Visit Date: 10/06/2021  Today's healthcare provider: [name], MD   No chief complaint on file.  Subjective    Dawn Mejia is a 40 y.o. female who presents today for a complete physical exam.  She reports consuming a {diet types:17450} diet. {Exercise:19826} She generally feels {well/fairly well/poorly:18703}. She reports sleeping {well/fairly well/poorly:18703}. She {does/does not:200015} have additional problems to discuss today.  HPI    Past Medical History:  Diagnosis Date   Acquired hypothyroidism 12/10/2020   Allergy    Anxiety    Asthma    Fatty liver disease, nonalcoholic 2/70/3500   Thyroid disease    Past Surgical History:  Procedure Laterality Date   BREAST ENHANCEMENT SURGERY     CHOLECYSTECTOMY     ROTATOR CUFF REPAIR Left 2020   WISDOM TOOTH EXTRACTION     Social History   Socioeconomic History   Marital status: Married    Spouse name: Not on file   Number of children: 2   Years of education: Not on file   Highest education level: Not on file  Occupational History   Occupation: Art therapist  Tobacco Use   Smoking status: Never   Smokeless tobacco: Never  Vaping Use   Vaping Use: Every day   Substances: Nicotine, Flavoring  Substance and Sexual Activity   Alcohol use: Yes    Alcohol/week: 0.0 - 2.0 standard drinks    Comment: occasional   Drug use: No   Sexual activity: Yes    Partners: Male    Birth control/protection: None  Other Topics Concern   Not on file  Social History Narrative   Not on file   Social Determinants of Health   Financial Resource Strain: Not on file  Food  Insecurity: Not on file  Transportation Needs: Not on file  Physical Activity: Not on file  Stress: Not on file  Social Connections: Not on file  Intimate Partner Violence: Not on file   Family Status  Relation Name Status   Mother  Alive   Father  Deceased   Sister  Alive   Daughter  Alive   Sister  Alive   Daughter  Nurse, adult  (Not Specified)   Neg Hx  (Not Specified)   Family History  Problem Relation Age of Onset   Diabetes Mother    Arthritis Mother    Hypertension Father    Asthma Father    COPD Father        smoker   Asthma Sister    Allergies Sister    Thyroid disease Sister    Asthma Daughter    Allergic Disorder Daughter    Healthy Sister    Healthy Daughter    Colon cancer Maternal Uncle 60   Breast cancer Neg Hx    Allergies  Allergen Reactions   Shellfish-Derived Products Anaphylaxis   Shellfish Allergy    Tramadol Itching    welts    Patient Care Team: Virginia Crews, MD as PCP - General (Family Medicine)   Medications: Outpatient Medications Prior to Visit  Medication Sig   albuterol (PROVENTIL HFA;VENTOLIN HFA) 108 (90 Base) MCG/ACT inhaler  Inhale into the lungs every 6 (six) hours as needed for wheezing or shortness of breath.   ALPRAZolam (XANAX) 0.5 MG tablet Take 0.5-1 tablets (0.25-0.5 mg total) by mouth 2 (two) times daily as needed for anxiety.   clomiPHENE (CLOMID) 50 MG tablet Take by mouth daily. Days 3-7 of cycle   levothyroxine (SYNTHROID) 25 MCG tablet Take 25 mcg by mouth daily before breakfast. Take 1 tablet (25 mcg total) by mouth once daily Take on an empty stomach with a glass of water at least 30-60 minutes before breakfast.   montelukast (SINGULAIR) 10 MG tablet Take 10 mg by mouth at bedtime.   pantoprazole (PROTONIX) 40 MG tablet Take by mouth.   Prenatal Vit-Fe Fumarate-FA (PRENATAL VITAMIN PO) Take by mouth.   sertraline (ZOLOFT) 50 MG tablet Take 1 tablet (50 mg total) by mouth daily.   traZODone (DESYREL)  100 MG tablet Take 2 tablets (200 mg total) by mouth at bedtime as needed for sleep.   No facility-administered medications prior to visit.    Review of Systems  All other systems reviewed and are negative.  Last CBC Lab Results  Component Value Date   WBC 6.5 05/01/2020   HGB 14.0 05/01/2020   HCT 41.2 05/01/2020   MCV 87 05/01/2020   MCH 29.4 05/01/2020   RDW 12.1 05/01/2020   PLT 190 04/88/8916   Last metabolic panel Lab Results  Component Value Date   GLUCOSE 118 (H) 01/29/2021   NA 140 01/29/2021   K 4.5 01/29/2021   CL 102 01/29/2021   CO2 26 01/29/2021   BUN 15 01/29/2021   CREATININE 1.03 (H) 01/29/2021   EGFR 70 01/29/2021   CALCIUM 10.1 01/29/2021   PROT 6.8 01/29/2021   ALBUMIN 4.6 01/29/2021   LABGLOB 2.2 01/29/2021   AGRATIO 2.1 01/29/2021   BILITOT 0.4 01/29/2021   ALKPHOS 111 01/29/2021   AST 58 (H) 01/29/2021   ALT 133 (H) 01/29/2021   Last lipids Lab Results  Component Value Date   CHOL 191 01/29/2021   HDL 39 (L) 01/29/2021   LDLCALC 122 (H) 01/29/2021   TRIG 169 (H) 01/29/2021   CHOLHDL 4.9 (H) 01/29/2021   Last hemoglobin A1c Lab Results  Component Value Date   HGBA1C 5.4 10/10/2020   Last thyroid functions Lab Results  Component Value Date   TSH 2.45 01/15/2021   Last vitamin D No results found for: 25OHVITD2, 25OHVITD3, VD25OH Last vitamin B12 and Folate No results found for: VITAMINB12, FOLATE    Objective    There were no vitals taken for this visit. BP Readings from Last 3 Encounters:  08/18/21 118/90  01/29/21 115/81  12/10/20 118/85   Wt Readings from Last 3 Encounters:  08/18/21 182 lb (82.6 kg)  01/29/21 186 lb 8 oz (84.6 kg)  12/10/20 184 lb 8 oz (83.7 kg)       Physical Exam  ***  Last depression screening scores    08/18/2021   11:09 AM 01/29/2021    2:45 PM 08/01/2020    8:28 AM  PHQ 2/9 Scores  PHQ - 2 Score 3 0 0  PHQ- 9 Score '17 2 2   ' Last fall risk screening    08/18/2021   11:09 AM  Fall  Risk   Falls in the past year? 0  Number falls in past yr: 0  Injury with Fall? 0  Risk for fall due to : No Fall Risks  Follow up Falls evaluation completed   Last Audit-C  alcohol use screening    08/18/2021   11:08 AM  Alcohol Use Disorder Test (AUDIT)  1. How often do you have a drink containing alcohol? 1  2. How many drinks containing alcohol do you have on a typical day when you are drinking? 0  3. How often do you have six or more drinks on one occasion? 0  AUDIT-C Score 1   A score of 3 or more in women, and 4 or more in men indicates increased risk for alcohol abuse, EXCEPT if all of the points are from question 1   No results found for any visits on 10/06/21.  Assessment & Plan    Routine Health Maintenance and Physical Exam  Exercise Activities and Dietary recommendations  Goals   None     Immunization History  Administered Date(s) Administered   Hepatitis B 02/20/2002, 05/07/2002, 11/15/2002   Influenza,inj,Quad PF,6+ Mos 02/07/2019   Influenza-Unspecified 02/14/2020, 01/29/2021   PFIZER(Purple Top)SARS-COV-2 Vaccination 12/20/2019, 01/10/2020, 06/30/2020   Tdap 01/29/2021    Health Maintenance  Topic Date Due   COVID-19 Vaccine (4 - Booster for Pfizer series) 08/25/2020   INFLUENZA VACCINE  12/15/2021   PAP SMEAR-Modifier  12/11/2023   TETANUS/TDAP  01/30/2031   Hepatitis C Screening  Completed   HIV Screening  Completed   HPV VACCINES  Aged Out    Discussed health benefits of physical activity, and encouraged her to engage in regular exercise appropriate for her age and condition.  ***  No follow-ups on file.     {provider attestation***:1}   [name], MD  [facility] [phone] (phone) [phone] (fax)  [place]

## 2021-10-06 ENCOUNTER — Ambulatory Visit (INDEPENDENT_AMBULATORY_CARE_PROVIDER_SITE_OTHER): Payer: 59 | Admitting: Family Medicine

## 2021-10-06 ENCOUNTER — Encounter: Payer: Self-pay | Admitting: Family Medicine

## 2021-10-06 VITALS — BP 96/68 | HR 79 | Temp 98.0°F | Resp 16 | Ht 64.0 in | Wt 179.0 lb

## 2021-10-06 DIAGNOSIS — Z0000 Encounter for general adult medical examination without abnormal findings: Secondary | ICD-10-CM | POA: Diagnosis not present

## 2021-10-06 DIAGNOSIS — E669 Obesity, unspecified: Secondary | ICD-10-CM

## 2021-10-06 DIAGNOSIS — R748 Abnormal levels of other serum enzymes: Secondary | ICD-10-CM

## 2021-10-06 DIAGNOSIS — Z1231 Encounter for screening mammogram for malignant neoplasm of breast: Secondary | ICD-10-CM

## 2021-10-06 DIAGNOSIS — E039 Hypothyroidism, unspecified: Secondary | ICD-10-CM

## 2021-10-06 DIAGNOSIS — Z6832 Body mass index (BMI) 32.0-32.9, adult: Secondary | ICD-10-CM

## 2021-10-06 DIAGNOSIS — Z6830 Body mass index (BMI) 30.0-30.9, adult: Secondary | ICD-10-CM

## 2021-10-06 DIAGNOSIS — J4530 Mild persistent asthma, uncomplicated: Secondary | ICD-10-CM | POA: Diagnosis not present

## 2021-10-06 DIAGNOSIS — E042 Nontoxic multinodular goiter: Secondary | ICD-10-CM

## 2021-10-06 DIAGNOSIS — R739 Hyperglycemia, unspecified: Secondary | ICD-10-CM

## 2021-10-06 NOTE — Assessment & Plan Note (Signed)
Discussed importance of healthy weight management Discussed diet and exercise  

## 2021-10-06 NOTE — Assessment & Plan Note (Signed)
Previously well controlled Continue Synthroid at current dose  Recheck TSH and adjust Synthroid as indicated   

## 2021-10-06 NOTE — Assessment & Plan Note (Signed)
Repeat US due to follow multiple nodules

## 2021-10-06 NOTE — Assessment & Plan Note (Signed)
Chronic and stable  Continue current meds

## 2021-10-07 ENCOUNTER — Encounter: Payer: Self-pay | Admitting: Family Medicine

## 2021-10-07 LAB — COMPREHENSIVE METABOLIC PANEL
ALT: 120 IU/L — ABNORMAL HIGH (ref 0–32)
AST: 57 IU/L — ABNORMAL HIGH (ref 0–40)
Albumin/Globulin Ratio: 2.4 — ABNORMAL HIGH (ref 1.2–2.2)
Albumin: 4.6 g/dL (ref 3.8–4.8)
Alkaline Phosphatase: 108 IU/L (ref 44–121)
BUN/Creatinine Ratio: 13 (ref 9–23)
BUN: 11 mg/dL (ref 6–24)
Bilirubin Total: 0.4 mg/dL (ref 0.0–1.2)
CO2: 24 mmol/L (ref 20–29)
Calcium: 9.9 mg/dL (ref 8.7–10.2)
Chloride: 100 mmol/L (ref 96–106)
Creatinine, Ser: 0.84 mg/dL (ref 0.57–1.00)
Globulin, Total: 1.9 g/dL (ref 1.5–4.5)
Glucose: 103 mg/dL — ABNORMAL HIGH (ref 70–99)
Potassium: 4.3 mmol/L (ref 3.5–5.2)
Sodium: 139 mmol/L (ref 134–144)
Total Protein: 6.5 g/dL (ref 6.0–8.5)
eGFR: 90 mL/min/{1.73_m2} (ref >59)

## 2021-10-07 LAB — LIPID PANEL WITH LDL/HDL RATIO
Cholesterol, Total: 171 mg/dL (ref 100–199)
HDL: 36 mg/dL — ABNORMAL LOW (ref >39)
LDL Chol Calc (NIH): 109 mg/dL — ABNORMAL HIGH (ref 0–99)
LDL/HDL Ratio: 3 ratio (ref 0.0–3.2)
Triglycerides: 144 mg/dL (ref 0–149)
VLDL Cholesterol Cal: 26 mg/dL (ref 5–40)

## 2021-10-07 LAB — HEMOGLOBIN A1C
Est. average glucose Bld gHb Est-mCnc: 111 mg/dL
Hgb A1c MFr Bld: 5.5 % (ref 4.8–5.6)

## 2021-10-07 LAB — TSH: TSH: 3.5 u[IU]/mL (ref 0.450–4.500)

## 2021-10-15 ENCOUNTER — Telehealth: Payer: Self-pay

## 2021-10-15 NOTE — Telephone Encounter (Signed)
Per [facility] they have all they need. They state they are waiting on pt to call them to schedule. I spoke to Malone and advised her to call them back

## 2021-10-15 NOTE — Telephone Encounter (Signed)
For the thyroid ultrasound?  Maybe we need to check with referral team to see why it is incomplete

## 2021-10-15 NOTE — Telephone Encounter (Signed)
Copied from [place] [id number]. Topic: Referral - Status >> Oct 15, 2021  9:18 AM Yvette Rack wrote: Reason for CRM: Pt stated she was advised by [facility] that the referral had to be sent back due to it being incomplete. Pt called to see if the information had been completed and returned back to [facility]. Pt requests call back. Cb# [phone]

## 2021-12-02 ENCOUNTER — Encounter: Payer: Self-pay | Admitting: Family Medicine

## 2021-12-03 ENCOUNTER — Other Ambulatory Visit: Payer: Self-pay | Admitting: Family Medicine

## 2021-12-03 DIAGNOSIS — E041 Nontoxic single thyroid nodule: Secondary | ICD-10-CM

## 2022-02-12 ENCOUNTER — Encounter: Payer: Self-pay | Admitting: Family Medicine

## 2022-02-16 ENCOUNTER — Encounter: Payer: Self-pay | Admitting: Family Medicine

## 2022-02-16 ENCOUNTER — Ambulatory Visit: Payer: 59 | Admitting: Family Medicine

## 2022-02-16 VITALS — BP 119/80 | HR 92 | Temp 98.2°F | Resp 16 | Wt 184.2 lb

## 2022-02-16 DIAGNOSIS — M25561 Pain in right knee: Secondary | ICD-10-CM | POA: Diagnosis not present

## 2022-02-16 DIAGNOSIS — G8929 Other chronic pain: Secondary | ICD-10-CM | POA: Diagnosis not present

## 2022-02-16 DIAGNOSIS — M25569 Pain in unspecified knee: Secondary | ICD-10-CM | POA: Insufficient documentation
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
## 2022-03-01 ENCOUNTER — Other Ambulatory Visit: Payer: Self-pay | Admitting: Family Medicine

## 2022-03-02 ENCOUNTER — Other Ambulatory Visit: Payer: Self-pay | Admitting: Family Medicine

## 2022-03-02 MED ORDER — PANTOPRAZOLE SODIUM 40 MG PO TBEC: 40.0000 mg | DELAYED_RELEASE_TABLET | Freq: Every day | ORAL | 0 refills | Status: DC

## 2022-03-02 MED ORDER — MONTELUKAST SODIUM 10 MG PO TABS: 10.0000 mg | ORAL_TABLET | Freq: Every day | ORAL | 0 refills | Status: DC

## 2022-03-02 MED ORDER — LEVOTHYROXINE SODIUM 25 MCG PO TABS: 25.0000 ug | ORAL_TABLET | Freq: Every day | ORAL | 0 refills | Status: DC

## 2022-03-02 MED ORDER — ALBUTEROL SULFATE HFA 108 (90 BASE) MCG/ACT IN AERS: 1.0000 | INHALATION_SPRAY | Freq: Four times a day (QID) | RESPIRATORY_TRACT | 0 refills | Status: AC | PRN

## 2022-03-16 ENCOUNTER — Other Ambulatory Visit: Payer: Self-pay | Admitting: Physician Assistant

## 2022-03-16 DIAGNOSIS — F5104 Psychophysiologic insomnia: Secondary | ICD-10-CM

## 2022-03-17 NOTE — Telephone Encounter (Signed)
Requested Prescriptions  Pending Prescriptions Disp Refills  . traZODone (DESYREL) 100 MG tablet [Pharmacy Med Name: traZODone HCl 100 MG Oral Tablet] 180 tablet 0    Sig: TAKE 2 TABLETS BY MOUTH AT BEDTIME AS NEEDED FOR SLEEP     Psychiatry: Antidepressants - Serotonin Modulator Passed - 03/16/2022  5:47 PM      Passed - Valid encounter within last 6 months    Recent Outpatient Visits          4 weeks ago Chronic pain of right knee   [facility] Myles Gip, DO   5 months ago Encounter for annual physical exam   [facility] [place], Dionne Bucy, MD   7 months ago [facility] [place], Dionne Bucy, MD   7 months ago Psychophysiological insomnia   [facility] Mikey Kirschner, PA-C   1 year ago [place], Dionne Bucy, MD      Future Appointments            In 1 week Bacigalupo, Dionne Bucy, MD [facility], [place]

## 2022-03-22 NOTE — Progress Notes (Unsigned)
I,Jyair Kiraly S Ethelle Ola,acting as a Neurosurgeon for Shirlee Latch, MD.,have documented all relevant documentation on the behalf of Shirlee Latch, MD,as directed by  Shirlee Latch, MD while in the presence of Shirlee Latch, MD.     Established patient visit   Patient: Dawn Mejia   DOB: 1980/12/20   41 y.o. Female  MRN: [id number] Visit Date: 03/25/2022  Today's healthcare provider: Shirlee Latch, MD   No chief complaint on file.  Subjective    HPI  Anxiety, Follow-up  She was last seen for anxiety 6 months ago. Changes made at last visit include no changes.   She reports {excellent/good/fair/poor:19665} compliance with treatment. She reports {good/fair/poor:18685} tolerance of treatment. She {is/is not:21021397} having side effects. {document side effects if present:1}  She feels her anxiety is {Desc; severity:60313} and {improved/worse/unchanged:3041574} since last visit.  Symptoms: {Yes/No:20286} chest pain {Yes/No:20286} difficulty concentrating  {Yes/No:20286} dizziness {Yes/No:20286} fatigue  {Yes/No:20286} feelings of losing control {Yes/No:20286} insomnia  {Yes/No:20286} irritable {Yes/No:20286} palpitations  {Yes/No:20286} panic attacks {Yes/No:20286} racing thoughts  {Yes/No:20286} shortness of breath {Yes/No:20286} sweating  {Yes/No:20286} tremors/shakes    GAD-7 Results    08/18/2021   11:02 AM 08/01/2020    8:48 AM 05/01/2020    9:05 AM  GAD-7 Generalized Anxiety Disorder Screening Tool  1. Feeling Nervous, Anxious, or on Edge 3 1 0  2. Not Being Able to Stop or Control Worrying 1 0 0  3. Worrying Too Much About Different Things 1 0 0  4. Trouble Relaxing 1 1 1   5. Being So Restless it's Hard To Sit Still 1 0 3  6. Becoming Easily Annoyed or Irritable 3 0 0  7. Feeling Afraid As If Something Awful Might Happen 0 0 0  Total GAD-7 Score 10 2 4   Difficulty At Work, Home, or Getting  Along With Others? Very difficult Not difficult at all Not  difficult at all    PHQ-9 Scores    02/16/2022    3:35 PM 10/06/2021    9:28 AM 08/18/2021   11:09 AM  PHQ9 SCORE ONLY  PHQ-9 Total Score 8 6 17     --------------------------------------------------------------------------------------------------- Hypothyroid, follow-up  Lab Results  Component Value Date   TSH 3.500 10/06/2021   TSH 2.45 01/15/2021   TSH 3.320 05/01/2020    Wt Readings from Last 3 Encounters:  02/16/22 184 lb 3.2 oz (83.6 kg)  10/06/21 179 lb (81.2 kg)  08/18/21 182 lb (82.6 kg)    She was last seen for hypothyroid 6 months ago.  Management since that visit includes ***. She reports {excellent/good/fair/poor:19665} compliance with treatment. She {is/is not:21021397} having side effects. {document side effects if present:1}  Symptoms: {Yes/No:20286} change in energy level {Yes/No:20286} constipation  {Yes/No:20286} diarrhea {Yes/No:20286} heat / cold intolerance  {Yes/No:20286} nervousness {Yes/No:20286} palpitations  {Yes/No:20286} weight changes    -----------------------------------------------------------------------------------------   Medications: Outpatient Medications Prior to Visit  Medication Sig   albuterol (VENTOLIN HFA) 108 (90 Base) MCG/ACT inhaler Inhale 1-2 puffs into the lungs every 6 (six) hours as needed for wheezing or shortness of breath.   ALPRAZolam (XANAX) 0.5 MG tablet TAKE 1/2 TO 1 (ONE-HALF TO ONE) TABLET BY MOUTH TWICE DAILY AS NEEDED FOR ANXIETY   clomiPHENE (CLOMID) 50 MG tablet Take by mouth as needed. Days 3-7 of cycle   levothyroxine (SYNTHROID) 25 MCG tablet Take 1 tablet (25 mcg total) by mouth daily before breakfast. Please schedule office visit before any future refill.   montelukast (SINGULAIR) 10 MG tablet Take 1 tablet (10 mg  total) by mouth at bedtime.   pantoprazole (PROTONIX) 40 MG tablet Take 1 tablet (40 mg total) by mouth daily.   Prenatal Vit-Fe Fumarate-FA (PRENATAL VITAMIN PO) Take by mouth.   sertraline  (ZOLOFT) 50 MG tablet Take 1 tablet (50 mg total) by mouth daily.   traZODone (DESYREL) 100 MG tablet TAKE 2 TABLETS BY MOUTH AT BEDTIME AS NEEDED FOR SLEEP   No facility-administered medications prior to visit.    Review of Systems  {Labs  Heme  Chem  Endocrine  Serology  Results Review (optional):23779}   Objective    There were no vitals taken for this visit. {Show previous vital signs (optional):23777}  Physical Exam  ***  No results found for any visits on 03/25/22.  Assessment & Plan     ***  No follow-ups on file.      {provider attestation***:1}   [name], MD  [facility] [phone] (phone) [phone] (fax)  [place]

## 2022-03-25 ENCOUNTER — Ambulatory Visit: Payer: 59 | Admitting: Family Medicine

## 2022-03-25 ENCOUNTER — Encounter: Payer: Self-pay | Admitting: Family Medicine

## 2022-03-25 VITALS — BP 116/80 | HR 90 | Temp 98.4°F | Resp 16 | Ht 64.0 in | Wt 183.3 lb

## 2022-03-25 DIAGNOSIS — K760 Fatty (change of) liver, not elsewhere classified: Secondary | ICD-10-CM

## 2022-03-25 DIAGNOSIS — E039 Hypothyroidism, unspecified: Secondary | ICD-10-CM | POA: Diagnosis not present

## 2022-03-25 DIAGNOSIS — E669 Obesity, unspecified: Secondary | ICD-10-CM

## 2022-03-25 DIAGNOSIS — R739 Hyperglycemia, unspecified: Secondary | ICD-10-CM | POA: Diagnosis not present

## 2022-03-25 DIAGNOSIS — F4323 Adjustment disorder with mixed anxiety and depressed mood: Secondary | ICD-10-CM

## 2022-03-25 DIAGNOSIS — Z6831 Body mass index (BMI) 31.0-31.9, adult: Secondary | ICD-10-CM

## 2022-03-25 DIAGNOSIS — F5104 Psychophysiologic insomnia: Secondary | ICD-10-CM

## 2022-03-25 MED ORDER — SERTRALINE HCL 100 MG PO TABS: 100.0000 mg | ORAL_TABLET | Freq: Every day | ORAL | 1 refills | Status: DC

## 2022-03-25 MED ORDER — TRAZODONE HCL 100 MG PO TABS: 250.0000 mg | ORAL_TABLET | Freq: Every evening | ORAL | 1 refills | Status: DC | PRN

## 2022-03-25 NOTE — Assessment & Plan Note (Signed)
Was doing well, but recent worsening in the setting of new thyroid cancer diagnosis Increase Zoloft 100 mg daily Encourage therapy

## 2022-03-25 NOTE — Assessment & Plan Note (Signed)
Chronic and stable Continue to monitor LFTs

## 2022-03-25 NOTE — Assessment & Plan Note (Signed)
Discussed importance of healthy weight management Discussed diet and exercise  

## 2022-03-25 NOTE — Assessment & Plan Note (Signed)
Chronic and stable Doing well on the trazodone 250 mg nightly dose

## 2022-03-25 NOTE — Assessment & Plan Note (Signed)
Status post recent FNA and was told that she has thyroid cancer She has an upcoming appointment with surgeon to discuss removal of her thyroid They will be monitoring her levothyroxine levels and TSH levels after surgery

## 2022-03-26 LAB — HEMOGLOBIN A1C
Est. average glucose Bld gHb Est-mCnc: 111 mg/dL
Hgb A1c MFr Bld: 5.5 % (ref 4.8–5.6)

## 2022-03-26 LAB — COMPREHENSIVE METABOLIC PANEL
ALT: 64 IU/L — ABNORMAL HIGH (ref 0–32)
AST: 37 IU/L (ref 0–40)
Albumin/Globulin Ratio: 2 (ref 1.2–2.2)
Albumin: 4.7 g/dL (ref 3.9–4.9)
Alkaline Phosphatase: 102 IU/L (ref 44–121)
BUN/Creatinine Ratio: 12 (ref 9–23)
BUN: 12 mg/dL (ref 6–24)
Bilirubin Total: 0.5 mg/dL (ref 0.0–1.2)
CO2: 24 mmol/L (ref 20–29)
Calcium: 9.9 mg/dL (ref 8.7–10.2)
Chloride: 100 mmol/L (ref 96–106)
Creatinine, Ser: 0.98 mg/dL (ref 0.57–1.00)
Globulin, Total: 2.3 g/dL (ref 1.5–4.5)
Glucose: 109 mg/dL — ABNORMAL HIGH (ref 70–99)
Potassium: 4.9 mmol/L (ref 3.5–5.2)
Sodium: 140 mmol/L (ref 134–144)
Total Protein: 7 g/dL (ref 6.0–8.5)
eGFR: 74 mL/min/{1.73_m2} (ref >59)

## 2022-04-02 ENCOUNTER — Ambulatory Visit: Payer: 59 | Admitting: Family Medicine

## 2022-04-06 ENCOUNTER — Telehealth: Payer: Self-pay | Admitting: Family Medicine

## 2022-04-06 ENCOUNTER — Other Ambulatory Visit: Payer: Self-pay

## 2022-04-06 DIAGNOSIS — E039 Hypothyroidism, unspecified: Secondary | ICD-10-CM

## 2022-04-06 MED ORDER — LEVOTHYROXINE SODIUM 25 MCG PO TABS: 25.0000 ug | ORAL_TABLET | Freq: Every day | ORAL | 0 refills | Status: DC

## 2022-04-06 NOTE — Telephone Encounter (Signed)
[facility] pharmacy faxed refill request for the following medications:   levothyroxine (SYNTHROID) 25 MCG tablet     Please advise

## 2022-06-24 NOTE — Progress Notes (Signed)
I,Dawn Mejia,acting as a Education administrator for Dawn Paganini, MD.,have documented all relevant documentation on the behalf of Dawn Paganini, MD,as directed by  Dawn Paganini, MD while in the presence of Dawn Paganini, MD.     Established patient visit   Patient: Dawn Mejia   DOB: December 27, 1980   41 y.o. Female  MRN: [id number] Visit Date: 06/25/2022  Today's healthcare provider: [name], MD   Chief Complaint  Patient presents with   Anxiety   Insomnia   Obesity   Subjective    HPI  Patient requesting referral to new pulmonology.   Anxiety, Follow-up  She was last seen for anxiety 3 months ago. Changes made at last visit include increase zoloft 100 mg daily.   She reports poor compliance with treatment. Patient reports she has not been taking zoloft for a few months.  She reports poor tolerance of treatment. She was having side effects. She reports she had no emotions with medication.  She feels her anxiety is moderate and Unchanged since last visit.  GAD-7 Results    06/25/2022    8:25 AM 03/25/2022    8:08 AM 08/18/2021   11:02 AM  GAD-7 Generalized Anxiety Disorder Screening Tool  1. Feeling Nervous, Anxious, or on Edge 1 1 3  $ 2. Not Being Able to Stop or Control Worrying 1 1 1  $ 3. Worrying Too Much About Different Things 1 1 1  $ 4. Trouble Relaxing 2 2 1  $ 5. Being So Restless it's Hard To Sit Still 1 2 1  $ 6. Becoming Easily Annoyed or Irritable 1 1 3  $ 7. Feeling Afraid As If Something Awful Might Happen 1 0 0  Total GAD-7 Score 8 8 10  $ Difficulty At Work, Home, or Getting  Along With Others? Somewhat difficult Somewhat difficult Very difficult    PHQ-9 Scores    06/25/2022    8:33 AM 03/25/2022    8:09 AM 02/16/2022    3:35 PM  PHQ9 SCORE ONLY  PHQ-9 Total Score 4 10 8    $ --------------------------------------------------------------------------------------------------- Follow up for insomnia  The patient was last seen for this 3  months ago. Changes made at last visit include no changes, continue trazodone 250 mg at bedtime.  She reports good compliance with treatment. Patient reports she takes 300 mg some nights to help with sleep. She feels that condition is Unchanged. She is not having side effects.   ----------------------------------------------------------------------------------------- Patient reports weight gain after thyroid surgery.   Medications: Outpatient Medications Prior to Visit  Medication Sig   albuterol (VENTOLIN HFA) 108 (90 Base) MCG/ACT inhaler Inhale 1-2 puffs into the lungs every 6 (six) hours as needed for wheezing or shortness of breath.   ALPRAZolam (XANAX) 0.5 MG tablet TAKE 1/2 TO 1 (ONE-HALF TO ONE) TABLET BY MOUTH TWICE DAILY AS NEEDED FOR ANXIETY   levothyroxine (SYNTHROID) 112 MCG tablet Take 112 mcg by mouth daily before breakfast.   montelukast (SINGULAIR) 10 MG tablet Take 1 tablet (10 mg total) by mouth at bedtime.   pantoprazole (PROTONIX) 40 MG tablet Take 1 tablet (40 mg total) by mouth daily.   [DISCONTINUED] traZODone (DESYREL) 100 MG tablet Take 2.5 tablets (250 mg total) by mouth at bedtime as needed for sleep. (Patient taking differently: Take 250 mg by mouth at bedtime as needed for sleep. 300 mg some nights)   [DISCONTINUED] clomiPHENE (CLOMID) 50 MG tablet Take by mouth as needed. Days 3-7 of cycle   [DISCONTINUED] levothyroxine (SYNTHROID) 25 MCG tablet Take 1  tablet (25 mcg total) by mouth daily before breakfast. Please schedule office visit before any future refill.   [DISCONTINUED] sertraline (ZOLOFT) 100 MG tablet Take 1 tablet (100 mg total) by mouth daily. (Patient not taking: Reported on 06/25/2022)   No facility-administered medications prior to visit.    Review of Systems  Constitutional:  Positive for appetite change and fatigue.  Respiratory:  Positive for chest tightness. Negative for cough and shortness of breath.   Cardiovascular:  Negative for chest  pain and palpitations.  Psychiatric/Behavioral:  Positive for decreased concentration and sleep disturbance. The patient is nervous/anxious.        Objective    BP 108/76 (BP Location: Left Arm, Patient Position: Sitting, Cuff Size: Large)   Pulse 98   Temp 98.6 F (37 C) (Temporal)   Resp 16   Wt 184 lb (83.5 kg)   BMI 31.58 kg/m    Physical Exam Vitals reviewed.  Constitutional:      General: She is not in acute distress.    Appearance: Normal appearance. She is well-developed. She is not diaphoretic.  HENT:     Head: Normocephalic and atraumatic.  Eyes:     General: No scleral icterus.    Conjunctiva/sclera: Conjunctivae normal.  Neck:     Thyroid: No thyromegaly.  Cardiovascular:     Rate and Rhythm: Normal rate and regular rhythm.     Heart sounds: Normal heart sounds. No murmur heard. Pulmonary:     Effort: Pulmonary effort is normal. No respiratory distress.     Breath sounds: Normal breath sounds. No wheezing, rhonchi or rales.  Musculoskeletal:     Right lower leg: No edema.     Left lower leg: No edema.  Skin:    General: Skin is warm and dry.     Findings: No rash.  Neurological:     Mental Status: She is alert and oriented to person, place, and time. Mental status is at baseline.  Psychiatric:        Mood and Affect: Mood normal.        Behavior: Behavior normal.       No results found for any visits on 06/25/22.  Assessment & Plan     Problem List Items Addressed This Visit       Respiratory   Asthma, stable, mild persistent - Primary    Well-controlled with no recent exacerbation Not needing albuterol very often Wants to see a different pulmonologist Referral placed today      Relevant Orders   Ambulatory referral to Pulmonology     Other   Insomnia    Has increased to trazodone 300 mg nightly and this is controlling her insomnia fairly well We discussed that this is the max dose She has had an issue with driving to [facility] while  asleep on Ambien previously so we will avoid Ambien and other similar medications      Relevant Medications   traZODone (DESYREL) 300 MG tablet   Anxiety    Chronic and fairly well-controlled With panic symptoms and PTSD symptoms was related to husband's previous OD Good placement no recent panic symptoms Did not tolerate Zoloft as she felt she was flat No longer in therapy We will continue to monitor      Relevant Medications   traZODone (DESYREL) 300 MG tablet   PTSD (post-traumatic stress disorder)    Well controlled currently  Uses xanax sparingly for panic symptoms      Relevant Medications   traZODone (  DESYREL) 300 MG tablet   Obesity    Discussed importance of healthy weight management Discussed diet and exercise         Return in about 6 months (around 12/24/2022) for CPE.      I, Dawn Paganini, MD, have reviewed all documentation for this visit. The documentation on 06/25/22 for the exam, diagnosis, procedures, and orders are all accurate and complete.   Omesha Bowerman, Dionne Bucy, MD, MPH [facility]

## 2022-06-25 ENCOUNTER — Encounter: Payer: Self-pay | Admitting: Family Medicine

## 2022-06-25 ENCOUNTER — Ambulatory Visit: Payer: 59 | Admitting: Family Medicine

## 2022-06-25 VITALS — BP 108/76 | HR 98 | Temp 98.6°F | Resp 16 | Wt 184.0 lb

## 2022-06-25 DIAGNOSIS — Z6831 Body mass index (BMI) 31.0-31.9, adult: Secondary | ICD-10-CM

## 2022-06-25 DIAGNOSIS — F4310 Post-traumatic stress disorder, unspecified: Secondary | ICD-10-CM | POA: Diagnosis not present

## 2022-06-25 DIAGNOSIS — F419 Anxiety disorder, unspecified: Secondary | ICD-10-CM

## 2022-06-25 DIAGNOSIS — F5104 Psychophysiologic insomnia: Secondary | ICD-10-CM | POA: Diagnosis not present

## 2022-06-25 DIAGNOSIS — J4530 Mild persistent asthma, uncomplicated: Secondary | ICD-10-CM

## 2022-06-25 DIAGNOSIS — E669 Obesity, unspecified: Secondary | ICD-10-CM

## 2022-06-25 MED ORDER — TRAZODONE HCL 300 MG PO TABS: 300.0000 mg | ORAL_TABLET | Freq: Every evening | ORAL | 1 refills | Status: DC | PRN

## 2022-06-25 NOTE — Assessment & Plan Note (Addendum)
Well controlled currently  Uses xanax sparingly for panic symptoms

## 2022-06-25 NOTE — Assessment & Plan Note (Signed)
Has increased to trazodone 300 mg nightly and this is controlling her insomnia fairly well We discussed that this is the max dose She has had an issue with driving to [facility] while asleep on Ambien previously so we will avoid Ambien and other similar medications

## 2022-06-25 NOTE — Assessment & Plan Note (Signed)
Chronic and fairly well-controlled With panic symptoms and PTSD symptoms was related to husband's previous OD Good placement no recent panic symptoms Did not tolerate Zoloft as she felt she was flat No longer in therapy We will continue to monitor

## 2022-06-25 NOTE — Assessment & Plan Note (Signed)
Well-controlled with no recent exacerbation Not needing albuterol very often Wants to see a different pulmonologist Referral placed today

## 2022-06-25 NOTE — Assessment & Plan Note (Signed)
Discussed importance of healthy weight management Discussed diet and exercise  

## 2022-06-28 ENCOUNTER — Encounter: Payer: Self-pay | Admitting: Family Medicine

## 2022-07-20 ENCOUNTER — Other Ambulatory Visit: Payer: Self-pay | Admitting: Family Medicine

## 2022-07-26 ENCOUNTER — Encounter: Payer: Self-pay | Admitting: Family Medicine

## 2022-09-06 ENCOUNTER — Encounter: Payer: Self-pay | Admitting: Family Medicine

## 2022-10-11 IMAGING — US US ABDOMEN LIMITED
1 series · 14 of 25 positions shown · non-contrast
Comparison: August 11, 2007

CLINICAL DATA: Elevated liver enzymes

EXAM:
ULTRASOUND ABDOMEN LIMITED RIGHT UPPER QUADRANT

[Series 1: us abdomen limited · 0.26mm/px · 14 of 37 slices shown]
[im 1/37]
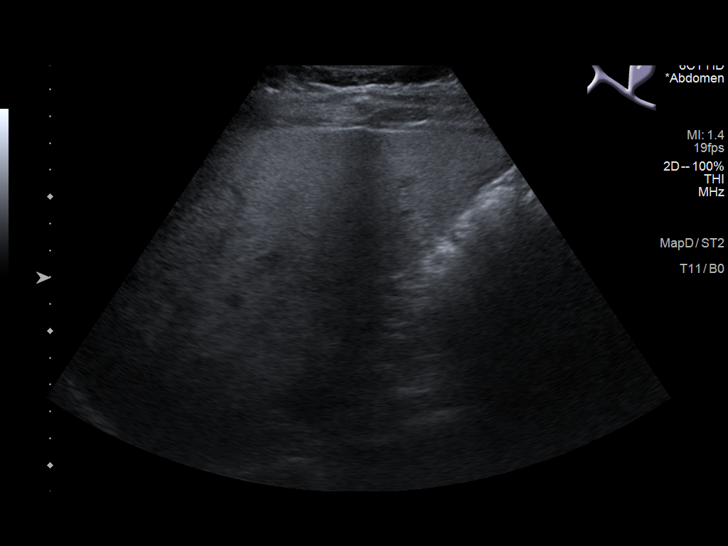
[im 4/37]
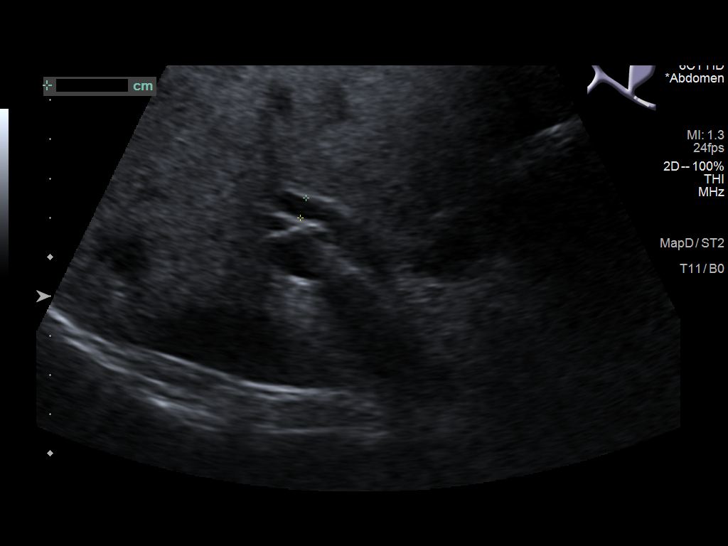
[im 7/37]
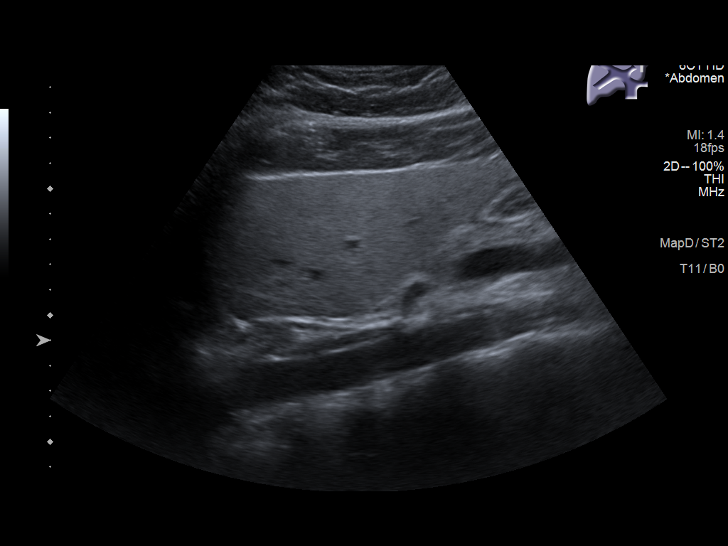
[im 10/37]
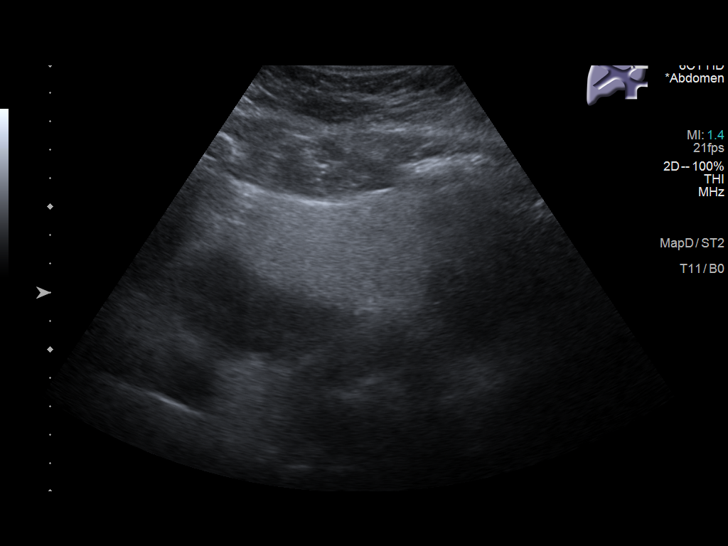
[im 13/37]
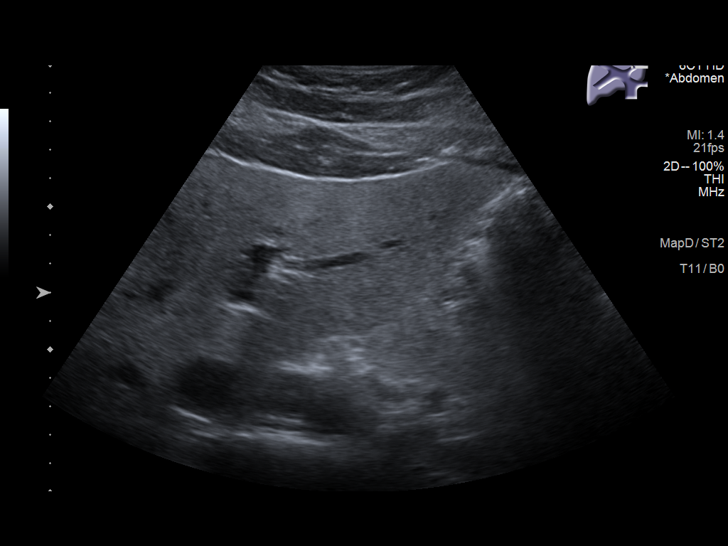
[im 14/37]
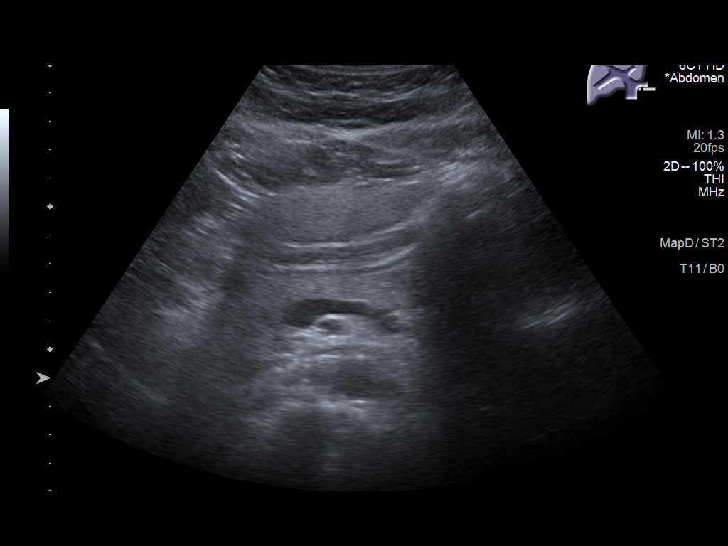
[im 17/37]
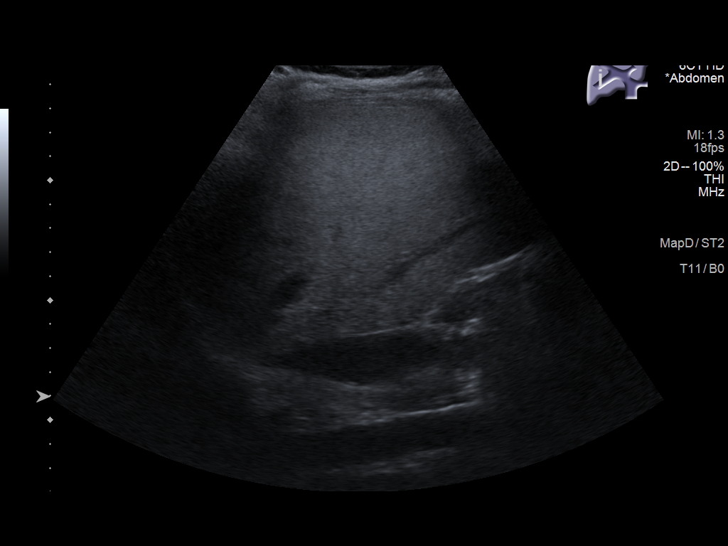
[im 20/37]
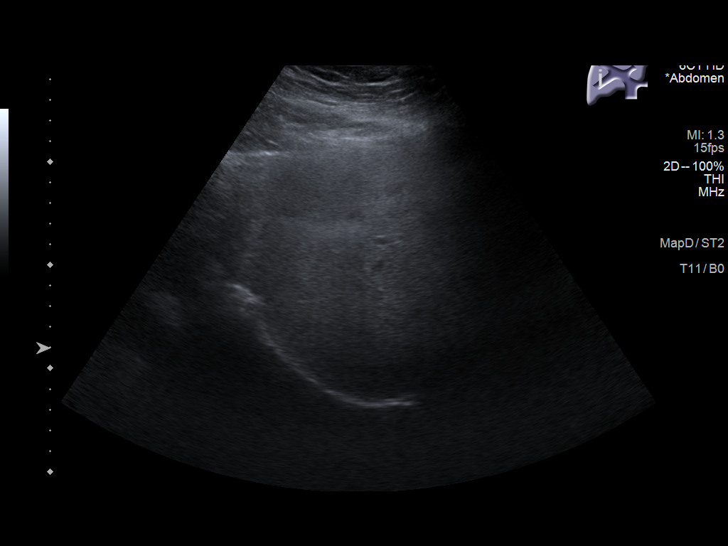
[im 23/37]
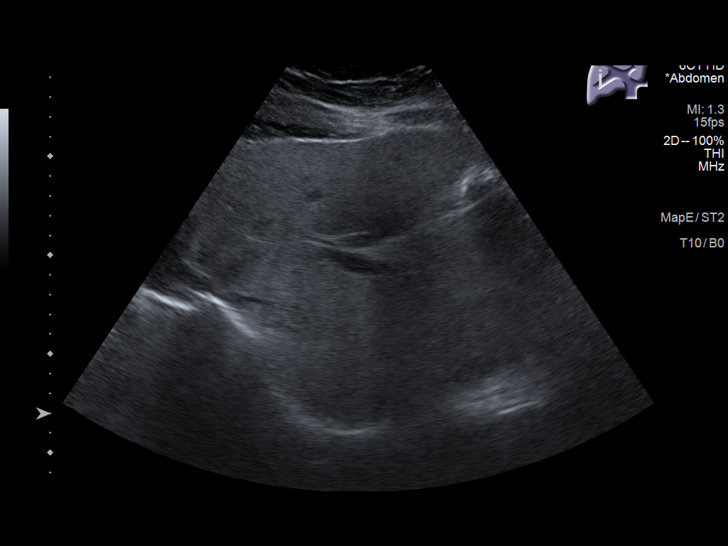
[im 25/37]
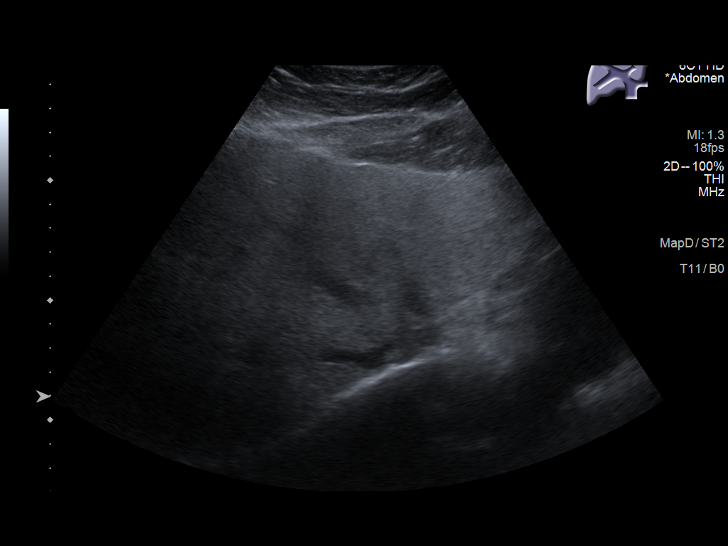
[im 28/37]
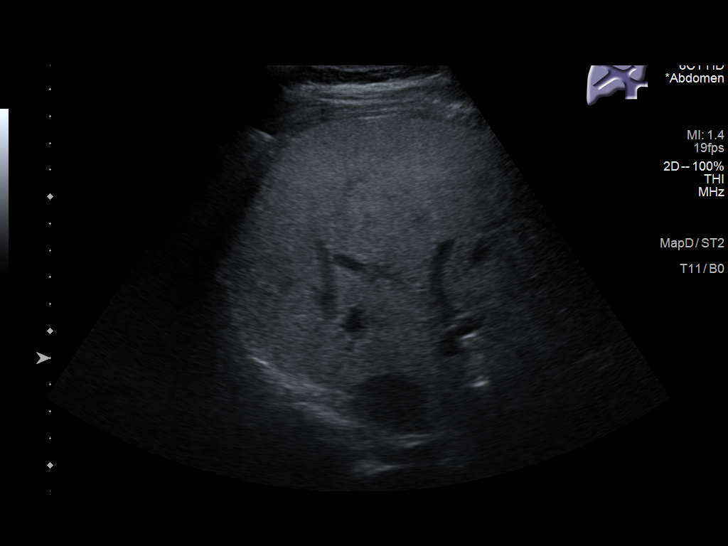
[im 31/37]
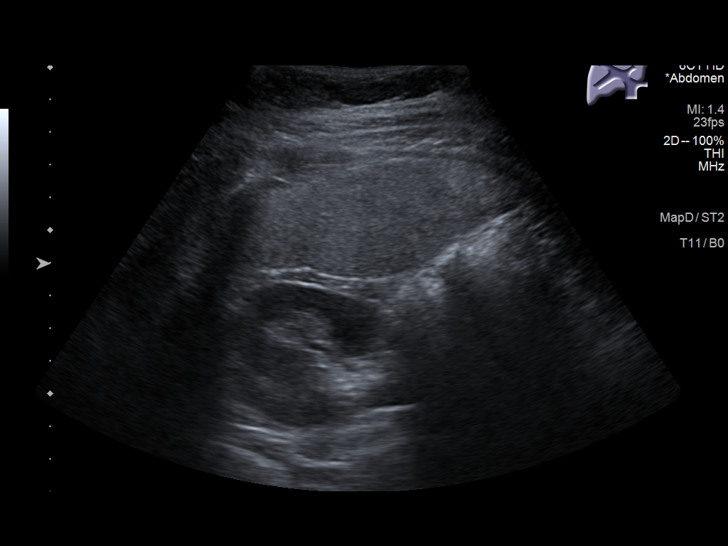
[im 34/37]
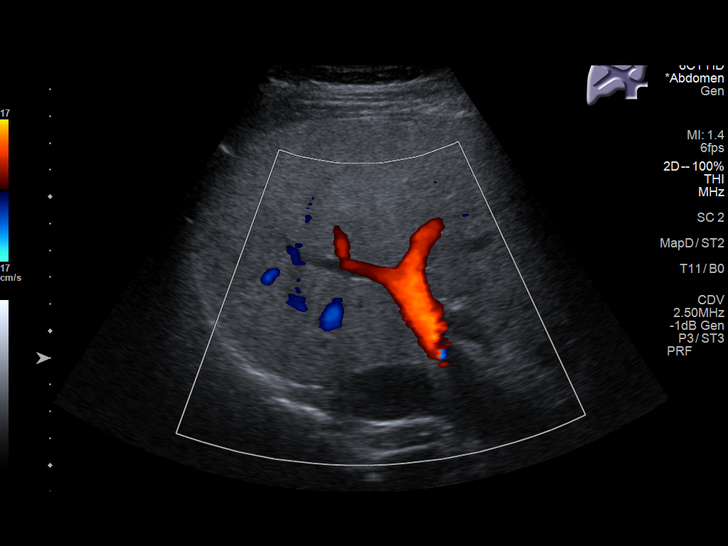
[im 37/37]
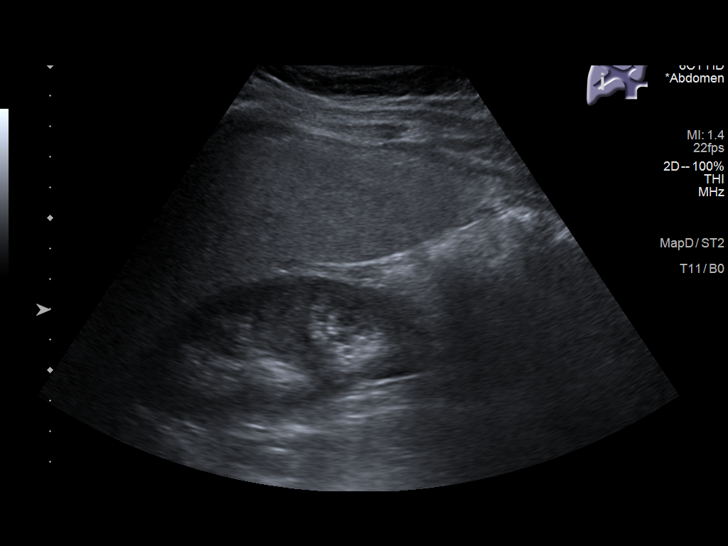

[14 of 25 positions shown; findings below may reference images not displayed]

FINDINGS: Gallbladder:

Surgically absent.

Common bile duct:

Diameter: 6 mm. No intrahepatic or extrahepatic biliary duct
dilatation.

Liver:

No focal lesion identified. Liver echogenicity overall is increased.
Portal vein is patent on color Doppler imaging with normal direction
of blood flow towards the liver.

Other: None.
IMPRESSION: 1. Diffuse increase in liver echogenicity, a finding indicative of
hepatic steatosis. No focal liver lesions are evident. Note that the
sensitivity of ultrasound for detection of focal liver lesions is
diminished in this circumstance.

2.  Gallbladder absent.

## 2022-10-18 ENCOUNTER — Encounter: Payer: Self-pay | Admitting: Family Medicine

## 2022-11-09 ENCOUNTER — Encounter: Payer: Self-pay | Admitting: Family Medicine

## 2022-11-09 ENCOUNTER — Ambulatory Visit: Payer: 59 | Admitting: Family Medicine

## 2022-11-09 VITALS — BP 94/68 | Temp 97.8°F | Resp 12 | Ht 64.0 in | Wt 184.9 lb

## 2022-11-09 DIAGNOSIS — E669 Obesity, unspecified: Secondary | ICD-10-CM

## 2022-11-09 DIAGNOSIS — L509 Urticaria, unspecified: Secondary | ICD-10-CM | POA: Diagnosis not present

## 2022-11-09 DIAGNOSIS — Z6831 Body mass index (BMI) 31.0-31.9, adult: Secondary | ICD-10-CM | POA: Diagnosis not present

## 2022-11-09 DIAGNOSIS — R635 Abnormal weight gain: Secondary | ICD-10-CM

## 2022-11-09 DIAGNOSIS — E039 Hypothyroidism, unspecified: Secondary | ICD-10-CM

## 2022-11-09 MED ORDER — NALTREXONE HCL 50 MG PO TABS: 25.0000 mg | ORAL_TABLET | Freq: Every day | ORAL | 3 refills | Status: DC

## 2022-11-09 MED ORDER — BUPROPION HCL ER (XL) 150 MG PO TB24: 150.0000 mg | ORAL_TABLET | Freq: Every day | ORAL | 3 refills | Status: DC

## 2022-11-09 NOTE — Progress Notes (Signed)
I,Sulibeya S Dimas,acting as a Neurosurgeon for Shirlee Latch, MD.,have documented all relevant documentation on the behalf of Shirlee Latch, MD,as directed by  Shirlee Latch, MD while in the presence of Shirlee Latch, MD.     Established patient visit   Patient: Dawn Mejia   DOB: [birth year]   42 y.o. Female  MRN: [id number] Visit Date: 11/09/2022  Today's healthcare provider: Shirlee Latch, MD   Chief Complaint  Patient presents with   Obesity   Subjective    HPI  Patient C/O about weight gain. She reports walking, eating healthier and drinking more water. She reports not abe to exercise more due to her asthma. She reports her thyroid is causing weight gain. She reports she has tried Atkins and [employer] in the past. She reports no weight loss with diets. She denies any oral medication for weight loss.   Wt Readings from Last 3 Encounters:  11/09/22 184 lb 14.4 oz (83.9 kg)  06/25/22 184 lb (83.5 kg)  03/25/22 183 lb 4.8 oz (83.1 kg)   Discussed the use of AI scribe software for clinical note transcription with the patient, who gave verbal consent to proceed.  History of Present Illness   The patient, with a history of thyroid cancer and bladder surgery, presents with concerns about weight gain and an unidentified allergic reaction. Despite being on levothyroxine, she reports difficulty losing weight and has noticed a steady increase in weight over the past year. She describes a healthy diet with occasional indulgences and expresses frustration with the weight gain.  In addition to the weight concerns, she has been experiencing sporadic itching and rashes. The itching is primarily located on the hands, feet, chest, and neck, while the rashes appear on the arms, face, and torso. She reports one severe episode where the rash extended to the legs and the entire arm was covered in welts. She has been attending allergy appointments to identify the cause but so far,  the allergen remains unidentified.  She also mentions a recent vacation and the impact of two surgeries and recovery periods on her lifestyle and eating habits. She acknowledges some emotional eating but denies binge eating.       Medications: Outpatient Medications Prior to Visit  Medication Sig   albuterol (VENTOLIN HFA) 108 (90 Base) MCG/ACT inhaler Inhale 1-2 puffs into the lungs every 6 (six) hours as needed for wheezing or shortness of breath.   ALPRAZolam (XANAX) 0.5 MG tablet TAKE 1/2 TO 1 TABLET BY MOUTH TWICE DAILY AS NEEDED FOR ANXIETY   levothyroxine (SYNTHROID) 112 MCG tablet Take 112 mcg by mouth daily before breakfast.   montelukast (SINGULAIR) 10 MG tablet TAKE 1 TABLET BY MOUTH AT BEDTIME   pantoprazole (PROTONIX) 40 MG tablet Take 1 tablet by mouth once daily   traZODone (DESYREL) 300 MG tablet Take 1 tablet (300 mg total) by mouth at bedtime as needed for sleep.   No facility-administered medications prior to visit.    Review of Systems per HPI     Objective    BP 94/68 (BP Location: Left Arm, Patient Position: Sitting, Cuff Size: Large)   Temp 97.8 F (36.6 C) (Temporal)   Resp 12   Ht 5\' 4"  (1.626 m)   Wt 184 lb 14.4 oz (83.9 kg)   BMI 31.74 kg/m    Physical Exam Vitals reviewed.  Constitutional:      General: She is not in acute distress.    Appearance: Normal appearance. She is well-developed. She  is not diaphoretic.  HENT:     Head: Normocephalic and atraumatic.  Eyes:     General: No scleral icterus.    Conjunctiva/sclera: Conjunctivae normal.  Neck:     Thyroid: No thyromegaly.  Cardiovascular:     Rate and Rhythm: Normal rate and regular rhythm.     Pulses: Normal pulses.     Heart sounds: Normal heart sounds. No murmur heard. Pulmonary:     Effort: Pulmonary effort is normal. No respiratory distress.     Breath sounds: Normal breath sounds. No wheezing, rhonchi or rales.  Musculoskeletal:     Cervical back: Neck supple.     Right lower  leg: No edema.     Left lower leg: No edema.  Lymphadenopathy:     Cervical: No cervical adenopathy.  Skin:    General: Skin is warm and dry.     Findings: No rash.  Neurological:     Mental Status: She is alert and oriented to person, place, and time. Mental status is at baseline.  Psychiatric:        Mood and Affect: Mood normal.        Behavior: Behavior normal.       No results found for any visits on 11/09/22.  Assessment & Plan     Problem List Items Addressed This Visit       Endocrine   Acquired hypothyroidism     Other   Obesity   Other Visit Diagnoses     Hives    -  Primary   Weight gain               Weight Gain: Despite healthy diet and regular exercise, patient reports difficulty losing weight. Levothyroxine dose is appropriate and TSH is within normal range, suggesting that weight gain is not due to hypothyroidism. Discussed the use of Contrave (Wellbutrin and naltrexone) to aid in weight loss. -Start Wellbutrin 150mg  daily in the morning. -Start Naltrexone 1/2 pill daily. -Reevaluate in 3 months.  Chronic Urticaria: Patient reports sporadic itching and flushing, with occasional hives. Currently taking Zyrtec and Benadryl as needed. Discussed the use of a dual histamine blocker regimen for better control. -Increase Zyrtec to twice daily. -Add Pepcid daily. -Continue to follow up with Allergy for further evaluation and management.  Hypothyroidism (post thyroidectomy for cancer): Patient is on Levothyroxine 100mg  daily. Last TSH was 0.51, which is within normal range. -Continue Levothyroxine 100mg  daily. -Check TSH in 6 months per Endo.  Post-Operative Scar Care: Patient reports sensitivity and occasional burning of the scar from thyroidectomy. Provided advice on desensitization techniques. -Continue to massage scar. -Use different textures to desensitize the area. -Continue to use sunscreen to protect the area.        Return in about 2 months  (around 01/09/2023) for as scheduled, CPE.      I, Shirlee Latch, MD, have reviewed all documentation for this visit. The documentation on 11/09/22 for the exam, diagnosis, procedures, and orders are all accurate and complete.   Desmin Daleo, Marzella Schlein, MD, MPH [facility]

## 2022-11-15 ENCOUNTER — Ambulatory Visit: Payer: 59 | Admitting: Family Medicine

## 2022-11-16 ENCOUNTER — Ambulatory Visit: Payer: 59 | Admitting: Family Medicine

## 2022-12-17 ENCOUNTER — Ambulatory Visit (INDEPENDENT_AMBULATORY_CARE_PROVIDER_SITE_OTHER): Payer: 59 | Admitting: Family Medicine

## 2022-12-17 ENCOUNTER — Encounter: Payer: Self-pay | Admitting: Family Medicine

## 2022-12-17 VITALS — BP 109/79 | HR 93 | Temp 97.9°F | Ht 64.0 in | Wt 182.0 lb

## 2022-12-17 DIAGNOSIS — Z0000 Encounter for general adult medical examination without abnormal findings: Secondary | ICD-10-CM

## 2022-12-17 DIAGNOSIS — E039 Hypothyroidism, unspecified: Secondary | ICD-10-CM | POA: Diagnosis not present

## 2022-12-17 DIAGNOSIS — F419 Anxiety disorder, unspecified: Secondary | ICD-10-CM

## 2022-12-17 DIAGNOSIS — R739 Hyperglycemia, unspecified: Secondary | ICD-10-CM | POA: Diagnosis not present

## 2022-12-17 DIAGNOSIS — K760 Fatty (change of) liver, not elsewhere classified: Secondary | ICD-10-CM

## 2022-12-17 DIAGNOSIS — F4310 Post-traumatic stress disorder, unspecified: Secondary | ICD-10-CM

## 2022-12-17 DIAGNOSIS — Z1231 Encounter for screening mammogram for malignant neoplasm of breast: Secondary | ICD-10-CM

## 2022-12-17 DIAGNOSIS — E782 Mixed hyperlipidemia: Secondary | ICD-10-CM

## 2022-12-17 MED ORDER — BUPROPION HCL ER (XL) 300 MG PO TB24: 300.0000 mg | ORAL_TABLET | Freq: Every day | ORAL | 1 refills | Status: DC

## 2022-12-17 MED ORDER — ALPRAZOLAM 0.5 MG PO TABS: 0.2500 mg | ORAL_TABLET | Freq: Two times a day (BID) | ORAL | 1 refills | Status: DC | PRN

## 2022-12-17 NOTE — Progress Notes (Signed)
Complete physical exam  Patient: Dawn Mejia   DOB: 1980/10/31   42 y.o. Female  MRN: [id number]  Subjective:    Chief Complaint  Patient presents with   Annual Exam    Patient feeling well with no complaints    Dawn Mejia is a 42 y.o. female who presents today for a complete physical exam. She reports consuming a general diet.     She generally feels well. She reports sleeping well. She does not have additional problems to discuss today.   Discussed the use of AI scribe software for clinical note transcription with the patient, who gave verbal consent to proceed.  History of Present Illness   A 42 year old with a history of hypothyroidism, thyroid cancer s/p thyroidectomy, fatty liver disease, anxiety, PTSD, insomnia, GERD, and obesity presents for an annual physical. She is currently managed on Singulair 10 mg QHS, trazodone 300 mg QHS as needed for sleep, Wellbutrin XL 150 mg daily and naltrexone 25 mg daily for weight loss, Synthroid 112 mcg daily for hypothyroidism, pantoprazole 40 mg daily for GERD, and Xanax 0.25 to 0.5 bid prn for anxiety/PTSD.  She reports no new symptoms or concerns. She is not due for a Pap smear until next year. She had a mammogram previously which showed cysts, and a follow-up mammogram is to be scheduled at [facility].        Most recent fall risk assessment:    12/17/2022    8:32 AM  Fall Risk   Falls in the past year? 0  Number falls in past yr: 0  Injury with Fall? 0     Most recent depression screenings:    12/17/2022    8:32 AM 11/09/2022    1:51 PM  PHQ 2/9 Scores  PHQ - 2 Score 0 3  PHQ- 9 Score 2 11        Patient Care Team: Erasmo Downer, MD as PCP - General (Family Medicine)   Outpatient Medications Prior to Visit  Medication Sig   albuterol (VENTOLIN HFA) 108 (90 Base) MCG/ACT inhaler Inhale 1-2 puffs into the lungs every 6 (six) hours as needed for wheezing or shortness of breath.   levothyroxine (SYNTHROID) 112  MCG tablet Take 112 mcg by mouth daily before breakfast.   montelukast (SINGULAIR) 10 MG tablet TAKE 1 TABLET BY MOUTH AT BEDTIME   naltrexone (DEPADE) 50 MG tablet Take 0.5 tablets (25 mg total) by mouth daily.   pantoprazole (PROTONIX) 40 MG tablet Take 1 tablet by mouth once daily   traZODone (DESYREL) 300 MG tablet Take 1 tablet (300 mg total) by mouth at bedtime as needed for sleep.   [DISCONTINUED] ALPRAZolam (XANAX) 0.5 MG tablet TAKE 1/2 TO 1 TABLET BY MOUTH TWICE DAILY AS NEEDED FOR ANXIETY   [DISCONTINUED] buPROPion (WELLBUTRIN XL) 150 MG 24 hr tablet Take 1 tablet (150 mg total) by mouth daily.   No facility-administered medications prior to visit.    ROS per HPI      Objective:     BP 109/79 (BP Location: Right Arm, Patient Position: Sitting, Cuff Size: Normal)   Pulse 93   Temp 97.9 F (36.6 C) (Oral)   Ht 5\' 4"  (1.626 m)   Wt 182 lb (82.6 kg)   SpO2 97%   BMI 31.24 kg/m    Physical Exam Vitals reviewed.  Constitutional:      General: She is not in acute distress.    Appearance: Normal appearance. She is well-developed. She is  not diaphoretic.  HENT:     Head: Normocephalic and atraumatic.     Right Ear: Tympanic membrane, ear canal and external ear normal.     Left Ear: Tympanic membrane, ear canal and external ear normal.     Nose: Nose normal.     Mouth/Throat:     Mouth: Mucous membranes are moist.     Pharynx: Oropharynx is clear. No oropharyngeal exudate.  Eyes:     General: No scleral icterus.    Conjunctiva/sclera: Conjunctivae normal.     Pupils: Pupils are equal, round, and reactive to light.  Neck:     Thyroid: No thyromegaly.  Cardiovascular:     Rate and Rhythm: Normal rate and regular rhythm.     Heart sounds: Normal heart sounds. No murmur heard. Pulmonary:     Effort: Pulmonary effort is normal. No respiratory distress.     Breath sounds: Normal breath sounds. No wheezing or rales.  Abdominal:     General: There is no distension.      Palpations: Abdomen is soft.     Tenderness: There is no abdominal tenderness.  Musculoskeletal:        General: No deformity.     Cervical back: Neck supple.     Right lower leg: No edema.     Left lower leg: No edema.  Lymphadenopathy:     Cervical: No cervical adenopathy.  Skin:    General: Skin is warm and dry.     Findings: No rash.  Neurological:     Mental Status: She is alert and oriented to person, place, and time. Mental status is at baseline.     Gait: Gait normal.  Psychiatric:        Mood and Affect: Mood normal.        Behavior: Behavior normal.        Thought Content: Thought content normal.      No results found for any visits on 12/17/22.     Assessment & Plan:    Routine Health Maintenance and Physical Exam  Immunization History  Administered Date(s) Administered   Hepatitis B 02/20/2002, 05/07/2002, 11/15/2002   Influenza,inj,Quad PF,6+ Mos 02/07/2019   Influenza-Unspecified 02/14/2020, 01/29/2021, 02/23/2022   PFIZER(Purple Top)SARS-COV-2 Vaccination 12/20/2019, 01/10/2020, 06/30/2020   Tdap 01/29/2021    Health Maintenance  Topic Date Due   COVID-19 Vaccine (4 - 2023-24 season) 01/15/2022   INFLUENZA VACCINE  08/16/2023 (Originally 12/16/2022)   PAP SMEAR-Modifier  12/11/2023   DTaP/Tdap/Td (2 - Td or Tdap) 01/30/2031   Hepatitis C Screening  Completed   HIV Screening  Completed   HPV VACCINES  Aged Out    Discussed health benefits of physical activity, and encouraged her to engage in regular exercise appropriate for her age and condition.  Problem List Items Addressed This Visit       Digestive   Fatty liver disease, nonalcoholic    No new complaints. -Order CMP today.      Relevant Orders   Comprehensive metabolic panel     Endocrine   Acquired hypothyroidism    Stable on Synthroid 112 mcg daily. -Continue current medication regimen. - Reviewed recent TSH.        Other   Anxiety    Managed with Xanax 0.25 to 0.5mg  BID PRN  - taking very infrequently. -Continue current medication regimen.      Relevant Medications   buPROPion (WELLBUTRIN XL) 300 MG 24 hr tablet   ALPRAZolam (XANAX) 0.5 MG tablet   PTSD (  post-traumatic stress disorder)    Managed with Xanax 0.25 to 0.5mg  BID PRN - taking very infrequently. -Continue current medication regimen.      Relevant Medications   buPROPion (WELLBUTRIN XL) 300 MG 24 hr tablet   ALPRAZolam (XANAX) 0.5 MG tablet   Moderate mixed hyperlipidemia not requiring statin therapy    No new complaints. -Order lipid panel today.      Relevant Orders   Comprehensive metabolic panel   Lipid Panel With LDL/HDL Ratio   Other Visit Diagnoses     Encounter for annual physical exam    -  Primary   Relevant Orders   Comprehensive metabolic panel   Hemoglobin A1c   Lipid Panel With LDL/HDL Ratio   Hyperglycemia       Relevant Orders   Hemoglobin A1c   Breast cancer screening by mammogram       Relevant Orders   MM 3D SCREENING MAMMOGRAM BILATERAL BREAST          Insomnia Managed with Trazodone 300mg  QHS PRN. -Continue current medication regimen.  GERD Stable on Pantoprazole 40mg  daily. -Continue current medication regimen.  General Health Maintenance -Order mammogram at [facility]. -Order A1C and CBC today. -Pap smear due next year.        Return in about 1 year (around 12/17/2023) for CPE.     Shirlee Latch, MD

## 2022-12-17 NOTE — Assessment & Plan Note (Signed)
Managed with Xanax 0.25 to 0.5mg  BID PRN - taking very infrequently. -Continue current medication regimen.

## 2022-12-17 NOTE — Assessment & Plan Note (Signed)
No new complaints. -Order lipid panel today.

## 2022-12-17 NOTE — Assessment & Plan Note (Signed)
No new complaints. -Order CMP today.

## 2022-12-17 NOTE — Assessment & Plan Note (Signed)
Stable on Synthroid 112 mcg daily. -Continue current medication regimen. - Reviewed recent TSH.

## 2022-12-23 ENCOUNTER — Other Ambulatory Visit: Payer: Self-pay | Admitting: Family Medicine

## 2022-12-23 DIAGNOSIS — F5104 Psychophysiologic insomnia: Secondary | ICD-10-CM

## 2023-02-28 ENCOUNTER — Other Ambulatory Visit: Payer: Self-pay | Admitting: Family Medicine

## 2023-02-28 NOTE — Telephone Encounter (Signed)
Requested Prescriptions  Refused Prescriptions Disp Refills   buPROPion (WELLBUTRIN XL) 150 MG 24 hr tablet [Pharmacy Med Name: buPROPion HCl ER (XL) 150 MG Oral Tablet Extended Release 24 Hour] 30 tablet 0    Sig: Take 1 tablet by mouth once daily     Psychiatry: Antidepressants - bupropion Failed - 02/28/2023  6:42 AM      Failed - ALT in normal range and within 360 days    ALT  Date Value Ref Range Status  12/17/2022 53 (H) 0 - 32 IU/L Final         Passed - Cr in normal range and within 360 days    Creatinine, Ser  Date Value Ref Range Status  12/17/2022 0.80 0.57 - 1.00 mg/dL Final         Passed - AST in normal range and within 360 days    AST  Date Value Ref Range Status  12/17/2022 19 0 - 40 IU/L Final         Passed - Last BP in normal range    BP Readings from Last 1 Encounters:  12/17/22 109/79         Passed - Valid encounter within last 6 months    Recent Outpatient Visits           2 months ago Encounter for annual physical exam   [facility] [place], Marzella Schlein, MD   3 months ago Hives   [facility] [place], Marzella Schlein, MD   8 months ago Asthma, stable, mild persistent   [facility] [place], Marzella Schlein, MD   11 months ago Acquired hypothyroidism   [place] [facility] [place], Marzella Schlein, MD   1 year ago Chronic pain of right knee   [facility] Caro [place], DO       Future Appointments             In 9 months Bacigalupo, Marzella Schlein, MD [facility], [facility]

## 2023-03-20 ENCOUNTER — Other Ambulatory Visit: Payer: Self-pay | Admitting: Family Medicine

## 2023-04-26 ENCOUNTER — Encounter: Payer: Self-pay | Admitting: Family Medicine

## 2023-05-12 ENCOUNTER — Other Ambulatory Visit: Payer: Self-pay | Admitting: Family Medicine

## 2023-05-12 DIAGNOSIS — F5104 Psychophysiologic insomnia: Secondary | ICD-10-CM

## 2023-05-12 MED ORDER — TRAZODONE HCL 100 MG PO TABS: 100.0000 mg | ORAL_TABLET | Freq: Every evening | ORAL | 0 refills | Status: DC | PRN

## 2023-05-24 ENCOUNTER — Other Ambulatory Visit: Payer: Self-pay | Admitting: Family Medicine

## 2023-05-24 DIAGNOSIS — F5104 Psychophysiologic insomnia: Secondary | ICD-10-CM

## 2023-05-24 MED ORDER — TRAZODONE HCL 300 MG PO TABS: 300.0000 mg | ORAL_TABLET | Freq: Every evening | ORAL | 1 refills | Status: DC | PRN

## 2023-05-25 NOTE — Telephone Encounter (Signed)
 D/C 05/24/23. Requested Prescriptions  Refused Prescriptions Disp Refills   traZODone  (DESYREL ) 100 MG tablet [Pharmacy Med Name: traZODone  HCl 100 MG Oral Tablet] 30 tablet 0    Sig: TAKE 1 TABLET BY MOUTH AT BEDTIME AS NEEDED FOR SLEEP     Psychiatry: Antidepressants - Serotonin Modulator Passed - 05/25/2023  1:35 PM      Passed - Valid encounter within last 6 months    Recent Outpatient Visits           5 months ago Encounter for annual physical exam   [place] [facility] [place], Jon HERO, MD   6 months ago Hives   [facility] [place], Jon HERO, MD   11 months ago Asthma, stable, mild persistent   [place] [facility] [place], Jon HERO, MD   1 year ago Acquired hypothyroidism   [facility] [place], Jon HERO, MD   1 year ago Chronic pain of right knee   [facility] [place] Donald HERO, DO       Future Appointments             In 7 months Bacigalupo, Jon HERO, MD [facility], [facility]

## 2023-05-26 MED ORDER — TRAZODONE HCL 100 MG PO TABS: 300.0000 mg | ORAL_TABLET | Freq: Every evening | ORAL | 1 refills | Status: DC | PRN

## 2023-05-26 NOTE — Addendum Note (Signed)
 Addended by: Erasmo Downer on: 05/26/2023 12:55 PM   Modules accepted: Orders

## 2023-06-15 ENCOUNTER — Ambulatory Visit: Payer: Self-pay | Admitting: *Deleted

## 2023-06-15 NOTE — Telephone Encounter (Signed)
Reason for Disposition  [1] MODERATE pain (e.g., interferes with normal activities) AND [2] pain comes and goes (cramps) AND [3] present > 24 hours  (Exception: Pain with Vomiting or Diarrhea - see that Guideline.)  Answer Assessment - Initial Assessment Questions 1. LOCATION: "Where does it hurt?"      I'm having sharp pain in my side.   This is the 4th day.  It's the right side. 2. RADIATION: "Does the pain shoot anywhere else?" (e.g., chest, back)     No  It feels like it goes through my side in the same spot every time it happens.   It's a sharp pain.   It's happened 7 times today.   I've had brief nausea. 3. ONSET: "When did the pain begin?" (e.g., minutes, hours or days ago)      4 days ago 4. SUDDEN: "Gradual or sudden onset?"     Suddenly   5. PATTERN "Does the pain come and go, or is it constant?"    - If it comes and goes: "How long does it last?" "Do you have pain now?"     (Note: Comes and goes means the pain is intermittent. It goes away completely between bouts.)    - If constant: "Is it getting better, staying the same, or getting worse?"      (Note: Constant means the pain never goes away completely; most serious pain is constant and gets worse.)      Intermittently 6. SEVERITY: "How bad is the pain?"  (e.g., Scale 1-10; mild, moderate, or severe)    - MILD (1-3): Doesn't interfere with normal activities, abdomen soft and not tender to touch.     - MODERATE (4-7): Interferes with normal activities or awakens from sleep, abdomen tender to touch.     - SEVERE (8-10): Excruciating pain, doubled over, unable to do any normal activities.       Sharp pain.    But it's also a dull discomfort.   I've had my gallbladder removed.   So it's not that.  7. RECURRENT SYMPTOM: "Have you ever had this type of stomach pain before?" If Yes, ask: "When was the last time?" and "What happened that time?"      No 8. CAUSE: "What do you think is causing the stomach pain?"     I don't know 9.  RELIEVING/AGGRAVATING FACTORS: "What makes it better or worse?" (e.g., antacids, bending or twisting motion, bowel movement)     Nothing 10. OTHER SYMPTOMS: "Do you have any other symptoms?" (e.g., back pain, diarrhea, fever, urination pain, vomiting)       No 11. PREGNANCY: "Is there any chance you are pregnant?" "When was your last menstrual period?"       Not asked  Protocols used: Abdominal Pain -  [facility]

## 2023-06-15 NOTE — Telephone Encounter (Signed)
  Chief Complaint: sharp pain in right side intermittently with dull constant pain.   Symptoms: above Frequency: for 4 days now Pertinent Negatives: Patient denies diarrhea or vomiting.   Occasional nausea. Disposition: [] ED /[] Urgent Care (no appt availability in office) / [x] Appointment(In office/virtual)/ []  [place] [facility]/ [] Home Care/ [] Refused Recommended Disposition /[] [place] [facility]/ []  Follow-up with PCP Additional Notes: Appt made with Dawn Lat, PA-C for 06/16/2023 at 1:00.

## 2023-06-16 ENCOUNTER — Ambulatory Visit: Payer: 59 | Admitting: Physician Assistant

## 2023-06-16 ENCOUNTER — Ambulatory Visit
Admission: RE | Admit: 2023-06-16 | Discharge: 2023-06-16 | Disposition: A | Discharge status: Home / Self Care | Payer: 59 | Source: Ambulatory Visit | Attending: Physician Assistant | Admitting: Physician Assistant

## 2023-06-16 VITALS — BP 108/81 | HR 89 | Resp 16 | Ht 64.0 in | Wt 189.1 lb

## 2023-06-16 DIAGNOSIS — R1011 Right upper quadrant pain: Secondary | ICD-10-CM

## 2023-06-16 NOTE — Progress Notes (Signed)
Established patient visit  Patient: Dawn Mejia   DOB: 1980/10/06   43 y.o. Female  MRN: [id number] Visit Date: 06/16/2023  Today's healthcare provider: Debera Lat, PA-C   Chief Complaint  Patient presents with   Abdominal Pain    Intermittent sharp pains in right upper quadrant beginning monday, with minor nausea and some loose stools   Headache   Subjective      Discussed the use of AI scribe software for clinical note transcription with the patient, who gave verbal consent to proceed.  History of Present Illness   The patient, with a history of gallbladder removal and recent hx of miscarriage, presents with a sharp, constant pain in the right upper quadrant of the abdomen. The pain, which has been worsening over the past few days, is not associated with food intake. The patient describes the pain as similar to the discomfort experienced with previous gallbladder issues, despite the gallbladder having been removed years ago. The patient also reports a recent miscarriage and a persistent headache that has been present for two days. The patient denies any changes in urine, fever, or any other symptoms.           06/16/2023    1:03 PM 12/17/2022    8:32 AM 11/09/2022    1:51 PM  Depression screen PHQ 2/9  Decreased Interest 1 0 1  Down, Depressed, Hopeless 2 0 2  PHQ - 2 Score 3 0 3  Altered sleeping 2 1 1   Tired, decreased energy 1 1 2   Change in appetite 0 0 3  Feeling bad or failure about yourself  0 0 0  Trouble concentrating 2 0 2  Moving slowly or fidgety/restless 0 0 0  Suicidal thoughts 0 0 0  PHQ-9 Score 8 2 11   Difficult doing work/chores  Not difficult at all Very difficult      06/16/2023    1:04 PM 06/25/2022    8:25 AM 03/25/2022    8:08 AM 08/18/2021   11:02 AM  GAD 7 : Generalized Anxiety Score  Nervous, Anxious, on Edge 1 1 1 3   Control/stop worrying 1 1 1 1   Worry too much - different things 1 1 1 1   Trouble relaxing 1 2 2 1   Restless 0 1 2 1    Easily annoyed or irritable 2 1 1 3   Afraid - awful might happen 0 1 0 0  Total GAD 7 Score 6 8 8 10   Anxiety Difficulty  Somewhat difficult Somewhat difficult Very difficult    Medications: Outpatient Medications Prior to Visit  Medication Sig   albuterol (VENTOLIN HFA) 108 (90 Base) MCG/ACT inhaler Inhale 1-2 puffs into the lungs every 6 (six) hours as needed for wheezing or shortness of breath.   ALPRAZolam (XANAX) 0.5 MG tablet Take 0.5-1 tablets (0.25-0.5 mg total) by mouth 2 (two) times daily as needed. for anxiety   buPROPion (WELLBUTRIN XL) 300 MG 24 hr tablet Take 1 tablet (300 mg total) by mouth daily.   levothyroxine (SYNTHROID) 125 MCG tablet Take 125 mcg by mouth daily before breakfast.   montelukast (SINGULAIR) 10 MG tablet TAKE 1 TABLET BY MOUTH AT BEDTIME   naltrexone (DEPADE) 50 MG tablet Take 1/2 (one-half) tablet by mouth once daily   pantoprazole (PROTONIX) 40 MG tablet Take 1 tablet by mouth once daily   trazodone (DESYREL) 100 MG tablet Take 3 tablets (300 mg total) by mouth at bedtime as needed for sleep.   [DISCONTINUED] levothyroxine (SYNTHROID) 112 MCG  tablet Take 112 mcg by mouth daily before breakfast.   No facility-administered medications prior to visit.    Review of Systems All negative Except see HPI       Objective    BP 108/81   Pulse 89   Resp 16   Ht 5\' 4"  (1.626 m)   Wt 189 lb 1.6 oz (85.8 kg)   BMI 32.46 kg/m     Physical Exam Vitals reviewed.  Constitutional:      General: She is not in acute distress.    Appearance: Normal appearance. She is well-developed. She is not diaphoretic.  HENT:     Head: Normocephalic and atraumatic.  Eyes:     General: No scleral icterus.    Conjunctiva/sclera: Conjunctivae normal.  Neck:     Thyroid: No thyromegaly.  Cardiovascular:     Rate and Rhythm: Normal rate and regular rhythm.     Pulses: Normal pulses.     Heart sounds: Normal heart sounds. No murmur heard. Pulmonary:     Effort:  Pulmonary effort is normal. No respiratory distress.     Breath sounds: Normal breath sounds. No wheezing, rhonchi or rales.  Abdominal:     General: There is distension.     Tenderness: There is abdominal tenderness (ruq, right side).  Musculoskeletal:     Cervical back: Neck supple.     Right lower leg: No edema.     Left lower leg: No edema.  Lymphadenopathy:     Cervical: No cervical adenopathy.  Skin:    General: Skin is warm and dry.     Findings: No rash.  Neurological:     Mental Status: She is alert and oriented to person, place, and time. Mental status is at baseline.  Psychiatric:        Mood and Affect: Mood normal.        Behavior: Behavior normal.      No results found for any visits on 06/16/23.      Assessment and Plan    Right Upper Quadrant Pain Sharp, intermittent pain in the right upper quadrant, similar to previous gallbladder pain, but gallbladder was removed years ago. No clear association with food intake. No other associated symptoms such as changes in urine or fever. -Order abdominal ultrasound to evaluate for possible causes of pain. -Order complete metabolic panel, lipase, and amylase to evaluate liver and pancreatic function. -Order urinalysis to rule out kidney issues or infection.  Recent Miscarriage Recent miscarriage with ongoing spotting. No other associated symptoms reported. -Continue monitoring and follow-up with OBGYN as scheduled.     Orders Placed This Encounter  Procedures   US Abdomen Complete    Standing Status:   Future    Expiration Date:   07/15/2023    Reason for Exam (SYMPTOM  OR DIAGNOSIS REQUIRED):   Abdominal pain    Preferred imaging location?:   [facility]    Call Results- Best Contact Number?:   [phone]   CBC with Differential/Platelet   Comprehensive metabolic panel   Amylase   Lipase   Urinalysis, Routine w reflex microscopic    No follow-ups on file.   The patient was advised to call back or  seek an in-person evaluation if the symptoms worsen or if the condition fails to improve as anticipated.  I discussed the assessment and treatment plan with the patient. The patient was provided an opportunity to ask questions and all were answered. The patient agreed with the plan and demonstrated an  understanding of the instructions.  I, Debera Lat, PA-C have reviewed all documentation for this visit. The documentation on 06/16/2023  for the exam, diagnosis, procedures, and orders are all accurate and complete.  Debera Lat, [facility], MMS [facility] [phone] (phone) [phone] (fax)  [facility]

## 2023-06-17 LAB — CBC WITH DIFFERENTIAL/PLATELET
Basophils Absolute: 0 10*3/uL (ref 0.0–0.2)
Basos: 0 %
EOS (ABSOLUTE): 0 10*3/uL (ref 0.0–0.4)
Eos: 0 %
Hematocrit: 41.9 % (ref 34.0–46.6)
Hemoglobin: 13.8 g/dL (ref 11.1–15.9)
Immature Grans (Abs): 0.1 10*3/uL (ref 0.0–0.1)
Immature Granulocytes: 1 %
Lymphocytes Absolute: 2.8 10*3/uL (ref 0.7–3.1)
Lymphs: 34 %
MCH: 29.1 pg (ref 26.6–33.0)
MCHC: 32.9 g/dL (ref 31.5–35.7)
MCV: 88 fL (ref 79–97)
Monocytes Absolute: 0.5 10*3/uL (ref 0.1–0.9)
Monocytes: 7 %
Neutrophils Absolute: 4.8 10*3/uL (ref 1.4–7.0)
Neutrophils: 58 %
Platelets: 241 10*3/uL (ref 150–450)
RBC: 4.75 x10E6/uL (ref 3.77–5.28)
RDW: 11.8 % (ref 11.7–15.4)
WBC: 8.1 10*3/uL (ref 3.4–10.8)

## 2023-06-17 LAB — MICROSCOPIC EXAMINATION
Casts: NONE SEEN /[LPF]
RBC, Urine: NONE SEEN /[HPF] (ref 0–2)

## 2023-06-17 LAB — URINALYSIS, ROUTINE W REFLEX MICROSCOPIC
Bilirubin, UA: NEGATIVE
Glucose, UA: NEGATIVE
Ketones, UA: NEGATIVE
Leukocytes,UA: NEGATIVE
Nitrite, UA: NEGATIVE
Protein,UA: NEGATIVE
Specific Gravity, UA: 1.014 (ref 1.005–1.030)
Urobilinogen, Ur: 0.2 mg/dL (ref 0.2–1.0)
pH, UA: 6 (ref 5.0–7.5)

## 2023-06-17 LAB — COMPREHENSIVE METABOLIC PANEL
ALT: 98 [IU]/L — ABNORMAL HIGH (ref 0–32)
AST: 55 [IU]/L — ABNORMAL HIGH (ref 0–40)
Albumin: 4.8 g/dL (ref 3.9–4.9)
Alkaline Phosphatase: 95 [IU]/L (ref 44–121)
BUN/Creatinine Ratio: 11 (ref 9–23)
BUN: 9 mg/dL (ref 6–24)
Bilirubin Total: 0.5 mg/dL (ref 0.0–1.2)
CO2: 26 mmol/L (ref 20–29)
Calcium: 9.5 mg/dL (ref 8.7–10.2)
Chloride: 99 mmol/L (ref 96–106)
Creatinine, Ser: 0.8 mg/dL (ref 0.57–1.00)
Globulin, Total: 2.2 g/dL (ref 1.5–4.5)
Glucose: 75 mg/dL (ref 70–99)
Potassium: 4.6 mmol/L (ref 3.5–5.2)
Sodium: 138 mmol/L (ref 134–144)
Total Protein: 7 g/dL (ref 6.0–8.5)
eGFR: 94 mL/min/{1.73_m2} (ref >59)

## 2023-06-17 LAB — LIPASE: Lipase: 38 U/L (ref 14–72)

## 2023-06-17 LAB — AMYLASE: Amylase: 75 U/L (ref 31–110)

## 2023-06-18 ENCOUNTER — Other Ambulatory Visit: Payer: Self-pay | Admitting: Family Medicine

## 2023-06-20 ENCOUNTER — Encounter: Payer: Self-pay | Admitting: Physician Assistant

## 2023-06-20 NOTE — Telephone Encounter (Signed)
Requested medication (s) are due for refill today: Yes  Requested medication (s) are on the active medication list: Yes  Last refill:  12/17/22  Future visit scheduled: Yes  Notes to clinic:  Unable to refill per protocol, cannot delegate.      Requested Prescriptions  Pending Prescriptions Disp Refills   ALPRAZolam (XANAX) 0.5 MG tablet [Pharmacy Med Name: ALPRAZolam 0.5 MG Oral Tablet] 20 tablet 0    Sig: TAKE 1/2 TO 1 (ONE-HALF TO ONE) TABLET BY MOUTH TWICE DAILY AS NEEDED FOR ANXIETY     Not Delegated - Psychiatry: Anxiolytics/Hypnotics 2 Failed - 06/20/2023  2:51 PM      Failed - This refill cannot be delegated      Failed - Urine Drug Screen completed in last 360 days      Passed - Patient is not pregnant      Passed - Valid encounter within last 6 months    Recent Outpatient Visits           4 days ago Right upper quadrant abdominal pain   [facility] [facility] [place], [place], PA-C   6 months ago Encounter for annual physical exam   [place] [facility] [place], Marzella Schlein, MD   7 months ago Hives   [facility] [place], Marzella Schlein, MD   12 months ago Asthma, stable, mild persistent   [place] [facility] [place], Marzella Schlein, MD   1 year ago Acquired hypothyroidism   [place] [facility] [place], Marzella Schlein, MD       Future Appointments             In 6 months Bacigalupo, Marzella Schlein, MD [facility], [facility]

## 2023-06-21 MED ORDER — ALPRAZOLAM 0.5 MG PO TABS: ORAL_TABLET | ORAL | 0 refills | Status: DC

## 2023-06-21 NOTE — Addendum Note (Signed)
Addended by: Erasmo Downer on: 06/21/2023 04:59 PM   Modules accepted: Orders

## 2023-08-05 ENCOUNTER — Other Ambulatory Visit: Payer: Self-pay | Admitting: Family Medicine

## 2023-08-08 NOTE — Telephone Encounter (Signed)
 Requested Prescriptions  Pending Prescriptions Disp Refills   pantoprazole (PROTONIX) 40 MG tablet [Pharmacy Med Name: Pantoprazole Sodium 40 MG Oral Tablet Delayed Release] 90 tablet 0    Sig: Take 1 tablet by mouth once daily     Gastroenterology: Proton Pump Inhibitors Passed - 08/08/2023 11:36 AM      Passed - Valid encounter within last 12 months    Recent Outpatient Visits           1 month ago Right upper quadrant abdominal pain   [place] [facility] [place], [place], PA-C   7 months ago Encounter for annual physical exam   [place] [facility] [place], Marzella Schlein, MD   9 months ago Hives   [facility] [place], Marzella Schlein, MD   1 year ago Asthma, stable, mild persistent   [place] [facility] [place], Marzella Schlein, MD   1 year ago Acquired hypothyroidism   [place] [facility] [place], Marzella Schlein, MD       Future Appointments             In 4 months Bacigalupo, Marzella Schlein, MD [facility], [facility]

## 2023-10-03 ENCOUNTER — Other Ambulatory Visit: Payer: Self-pay | Admitting: Family Medicine

## 2023-11-19 ENCOUNTER — Other Ambulatory Visit: Payer: Self-pay | Admitting: Family Medicine

## 2023-11-22 NOTE — Telephone Encounter (Signed)
 Requested Prescriptions  Pending Prescriptions Disp Refills   pantoprazole  (PROTONIX ) 40 MG tablet [Pharmacy Med Name: Pantoprazole  Sodium 40 MG Oral Tablet Delayed Release] 90 tablet 0    Sig: Take 1 tablet by mouth once daily     Gastroenterology: Proton Pump Inhibitors Failed - 11/22/2023 12:49 PM      Failed - Valid encounter within last 12 months    Recent Outpatient Visits   None     Future Appointments             In 1 month Bacigalupo, Jon HERO, MD [facility], [facility]

## 2023-12-02 ENCOUNTER — Encounter: Payer: Self-pay | Admitting: Family Medicine

## 2023-12-02 DIAGNOSIS — L989 Disorder of the skin and subcutaneous tissue, unspecified: Secondary | ICD-10-CM

## 2023-12-13 ENCOUNTER — Encounter: Admitting: Family Medicine

## 2023-12-20 ENCOUNTER — Ambulatory Visit (INDEPENDENT_AMBULATORY_CARE_PROVIDER_SITE_OTHER): Admitting: Family Medicine

## 2023-12-20 ENCOUNTER — Encounter: Payer: Self-pay | Admitting: Family Medicine

## 2023-12-20 VITALS — BP 109/71 | HR 97 | Resp 16 | Ht 64.0 in | Wt 185.0 lb

## 2023-12-20 DIAGNOSIS — F4310 Post-traumatic stress disorder, unspecified: Secondary | ICD-10-CM

## 2023-12-20 DIAGNOSIS — K760 Fatty (change of) liver, not elsewhere classified: Secondary | ICD-10-CM | POA: Diagnosis not present

## 2023-12-20 DIAGNOSIS — E039 Hypothyroidism, unspecified: Secondary | ICD-10-CM

## 2023-12-20 DIAGNOSIS — F419 Anxiety disorder, unspecified: Secondary | ICD-10-CM

## 2023-12-20 DIAGNOSIS — E66811 Obesity, class 1: Secondary | ICD-10-CM

## 2023-12-20 DIAGNOSIS — Z6831 Body mass index (BMI) 31.0-31.9, adult: Secondary | ICD-10-CM

## 2023-12-20 DIAGNOSIS — Z0000 Encounter for general adult medical examination without abnormal findings: Secondary | ICD-10-CM | POA: Diagnosis not present

## 2023-12-20 DIAGNOSIS — E782 Mixed hyperlipidemia: Secondary | ICD-10-CM

## 2023-12-20 MED ORDER — NALTREXONE HCL 50 MG PO TABS: 25.0000 mg | ORAL_TABLET | Freq: Two times a day (BID) | ORAL | 3 refills | Status: DC
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
## 2023-12-21 ENCOUNTER — Other Ambulatory Visit: Payer: Self-pay

## 2023-12-21 DIAGNOSIS — L989 Disorder of the skin and subcutaneous tissue, unspecified: Secondary | ICD-10-CM

## 2023-12-21 NOTE — Telephone Encounter (Signed)
 Ok to place referral as requested. Use diagnosis skin lesion

## 2023-12-21 NOTE — Telephone Encounter (Signed)
 Please see the pt message below and advise

## 2023-12-23 ENCOUNTER — Encounter: Payer: Self-pay | Admitting: Family Medicine

## 2024-01-05 ENCOUNTER — Other Ambulatory Visit: Payer: Self-pay | Admitting: Family Medicine

## 2024-01-06 NOTE — Telephone Encounter (Signed)
 Requested medication (s) are due for refill today - yes/no  Requested medication (s) are on the active medication list -yes  Future visit scheduled -yes  Last refill: bupropion -12/17/22 #90 1RF- fails lab protocol- abnormal labs                 Naltrexone -12/20/23 #30 3RF- non delegated Rx, too soon  Notes to clinic: see above  Requested Prescriptions  Pending Prescriptions Disp Refills   buPROPion  (WELLBUTRIN  XL) 300 MG 24 hr tablet [Pharmacy Med Name: buPROPion  HCl ER (XL) 300 MG Oral Tablet Extended Release 24 Hour] 30 tablet 0    Sig: Take 1 tablet by mouth once daily     Psychiatry: Antidepressants - bupropion  Failed - 01/06/2024  3:50 PM      Failed - AST in normal range and within 360 days    AST  Date Value Ref Range Status  06/16/2023 55 (H) 0 - 40 IU/L Final         Failed - ALT in normal range and within 360 days    ALT  Date Value Ref Range Status  06/16/2023 98 (H) 0 - 32 IU/L Final         Passed - Cr in normal range and within 360 days    Creatinine, Ser  Date Value Ref Range Status  06/16/2023 0.80 0.57 - 1.00 mg/dL Final         Passed - Last BP in normal range    BP Readings from Last 1 Encounters:  12/20/23 109/71         Passed - Valid encounter within last 6 months    Recent Outpatient Visits           2 weeks ago Encounter for annual physical exam   [facility] [place], Jon HERO, MD               naltrexone  (DEPADE) 50 MG tablet [Pharmacy Med Name: Naltrexone  HCl 50 MG Oral Tablet] 15 tablet 0    Sig: Take 1/2 (one-half) tablet by mouth once daily     Not Delegated - Psychiatry: Drug Dependence Therapy - naltrexone  Failed - 01/06/2024  3:50 PM      Failed - This refill cannot be delegated      Passed - Completed PHQ-2 or PHQ-9 in the last 360 days      Passed - Valid encounter within last 6 months    Recent Outpatient Visits           2 weeks ago Encounter for annual physical exam   [place]  [facility] [place], Jon HERO, MD                 Requested Prescriptions  Pending Prescriptions Disp Refills   buPROPion  (WELLBUTRIN  XL) 300 MG 24 hr tablet [Pharmacy Med Name: buPROPion  HCl ER (XL) 300 MG Oral Tablet Extended Release 24 Hour] 30 tablet 0    Sig: Take 1 tablet by mouth once daily     Psychiatry: Antidepressants - bupropion  Failed - 01/06/2024  3:50 PM      Failed - AST in normal range and within 360 days    AST  Date Value Ref Range Status  06/16/2023 55 (H) 0 - 40 IU/L Final         Failed - ALT in normal range and within 360 days    ALT  Date Value Ref Range Status  06/16/2023 98 (H) 0 - 32 IU/L Final  Passed - Cr in normal range and within 360 days    Creatinine, Ser  Date Value Ref Range Status  06/16/2023 0.80 0.57 - 1.00 mg/dL Final         Passed - Last BP in normal range    BP Readings from Last 1 Encounters:  12/20/23 109/71         Passed - Valid encounter within last 6 months    Recent Outpatient Visits           2 weeks ago Encounter for annual physical exam   [place] [facility] [place], Jon HERO, MD               naltrexone  (DEPADE) 50 MG tablet [Pharmacy Med Name: Naltrexone  HCl 50 MG Oral Tablet] 15 tablet 0    Sig: Take 1/2 (one-half) tablet by mouth once daily     Not Delegated - Psychiatry: Drug Dependence Therapy - naltrexone  Failed - 01/06/2024  3:50 PM      Failed - This refill cannot be delegated      Passed - Completed PHQ-2 or PHQ-9 in the last 360 days      Passed - Valid encounter within last 6 months    Recent Outpatient Visits           2 weeks ago Encounter for annual physical exam   [facility] [place], Jon HERO, MD

## 2024-01-11 ENCOUNTER — Other Ambulatory Visit: Payer: Self-pay | Admitting: Family Medicine

## 2024-01-26 ENCOUNTER — Ambulatory Visit: Payer: Self-pay | Admitting: Family Medicine

## 2024-01-26 LAB — HEPATIC FUNCTION PANEL
ALT: 47 IU/L — ABNORMAL HIGH (ref 0–32)
AST: 22 IU/L (ref 0–40)
Albumin: 4.2 g/dL (ref 3.9–4.9)
Alkaline Phosphatase: 79 IU/L (ref 44–121)
Bilirubin Total: 0.3 mg/dL (ref 0.0–1.2)
Bilirubin, Direct: 0.1 mg/dL (ref 0.00–0.40)
Total Protein: 5.9 g/dL — ABNORMAL LOW (ref 6.0–8.5)

## 2024-01-26 LAB — LIPID PANEL
Chol/HDL Ratio: 3.8 ratio (ref 0.0–4.4)
Cholesterol, Total: 134 mg/dL (ref 100–199)
HDL: 35 mg/dL — ABNORMAL LOW (ref >39)
LDL Chol Calc (NIH): 65 mg/dL (ref 0–99)
Triglycerides: 202 mg/dL — ABNORMAL HIGH (ref 0–149)
VLDL Cholesterol Cal: 34 mg/dL (ref 5–40)

## 2024-02-06 ENCOUNTER — Other Ambulatory Visit: Payer: Self-pay | Admitting: Family Medicine

## 2024-02-07 ENCOUNTER — Other Ambulatory Visit: Payer: Self-pay

## 2024-02-07 MED ORDER — ALPRAZOLAM 0.5 MG PO TABS: ORAL_TABLET | ORAL | 0 refills | Status: AC

## 2024-02-07 NOTE — Telephone Encounter (Signed)
 Requested medication (s) are due for refill today: Yes  Requested medication (s) are on the active medication list: Yes  Last refill:  10/03/23  Future visit scheduled: Yes  Notes to clinic:  Not delegated.    Requested Prescriptions  Pending Prescriptions Disp Refills   ALPRAZolam  (XANAX ) 0.5 MG tablet [Pharmacy Med Name: ALPRAZolam  0.5 MG Oral Tablet] 20 tablet 0    Sig: TAKE 1/2 TO 1 (ONE-HALF TO ONE) TABLET BY MOUTH TWICE DAILY AS NEEDED FOR ANXIETY     Not Delegated - Psychiatry: Anxiolytics/Hypnotics 2 Failed - 02/07/2024  3:50 PM      Failed - This refill cannot be delegated      Failed - Urine Drug Screen completed in last 360 days      Passed - Patient is not pregnant      Passed - Valid encounter within last 6 months    Recent Outpatient Visits           1 month ago Encounter for annual physical exam   [facility] [facility] [place], Jon HERO, MD              Signed Prescriptions Disp Refills   buPROPion  (WELLBUTRIN  XL) 300 MG 24 hr tablet 30 tablet 0    Sig: Take 1 tablet by mouth once daily     Psychiatry: Antidepressants - bupropion  Failed - 02/07/2024  3:50 PM      Failed - ALT in normal range and within 360 days    ALT  Date Value Ref Range Status  01/25/2024 47 (H) 0 - 32 IU/L Final         Passed - Cr in normal range and within 360 days    Creatinine, Ser  Date Value Ref Range Status  06/16/2023 0.80 0.57 - 1.00 mg/dL Final         Passed - AST in normal range and within 360 days    AST  Date Value Ref Range Status  01/25/2024 22 0 - 40 IU/L Final         Passed - Last BP in normal range    BP Readings from Last 1 Encounters:  12/20/23 109/71         Passed - Valid encounter within last 6 months    Recent Outpatient Visits           1 month ago Encounter for annual physical exam   [place] [facility] [place], Jon HERO, MD               pantoprazole  (PROTONIX ) 40 MG tablet 90 tablet 0     Sig: Take 1 tablet by mouth once daily     Gastroenterology: Proton Pump Inhibitors Passed - 02/07/2024  3:50 PM      Passed - Valid encounter within last 12 months    Recent Outpatient Visits           1 month ago Encounter for annual physical exam   [facility] [place], Jon HERO, MD

## 2024-02-07 NOTE — Telephone Encounter (Signed)
 Requested Prescriptions  Pending Prescriptions Disp Refills   ALPRAZolam  (XANAX ) 0.5 MG tablet [Pharmacy Med Name: ALPRAZolam  0.5 MG Oral Tablet] 20 tablet 0    Sig: TAKE 1/2 TO 1 (ONE-HALF TO ONE) TABLET BY MOUTH TWICE DAILY AS NEEDED FOR ANXIETY     Not Delegated - Psychiatry: Anxiolytics/Hypnotics 2 Failed - 02/07/2024  3:49 PM      Failed - This refill cannot be delegated      Failed - Urine Drug Screen completed in last 360 days      Passed - Patient is not pregnant      Passed - Valid encounter within last 6 months    Recent Outpatient Visits           1 month ago Encounter for annual physical exam   [place] [facility] [place], Jon HERO, MD               buPROPion  (WELLBUTRIN  XL) 300 MG 24 hr tablet [Pharmacy Med Name: buPROPion  HCl ER (XL) 300 MG Oral Tablet Extended Release 24 Hour] 30 tablet 0    Sig: Take 1 tablet by mouth once daily     Psychiatry: Antidepressants - bupropion  Failed - 02/07/2024  3:49 PM      Failed - ALT in normal range and within 360 days    ALT  Date Value Ref Range Status  01/25/2024 47 (H) 0 - 32 IU/L Final         Passed - Cr in normal range and within 360 days    Creatinine, Ser  Date Value Ref Range Status  06/16/2023 0.80 0.57 - 1.00 mg/dL Final         Passed - AST in normal range and within 360 days    AST  Date Value Ref Range Status  01/25/2024 22 0 - 40 IU/L Final         Passed - Last BP in normal range    BP Readings from Last 1 Encounters:  12/20/23 109/71         Passed - Valid encounter within last 6 months    Recent Outpatient Visits           1 month ago Encounter for annual physical exam   [facility] [place], Jon HERO, MD               pantoprazole  (PROTONIX ) 40 MG tablet [Pharmacy Med Name: Pantoprazole  Sodium 40 MG Oral Tablet Delayed Release] 90 tablet 0    Sig: Take 1 tablet by mouth once daily     Gastroenterology: Proton Pump Inhibitors  Passed - 02/07/2024  3:49 PM      Passed - Valid encounter within last 12 months    Recent Outpatient Visits           1 month ago Encounter for annual physical exam   [facility] [place], Jon HERO, MD

## 2024-04-26 ENCOUNTER — Encounter: Payer: Self-pay | Admitting: Family Medicine

## 2024-05-27 ENCOUNTER — Other Ambulatory Visit: Payer: Self-pay | Admitting: Family Medicine

## 2024-05-29 ENCOUNTER — Telehealth: Admitting: Family Medicine

## 2024-05-29 ENCOUNTER — Encounter: Payer: Self-pay | Admitting: Family Medicine

## 2024-05-29 ENCOUNTER — Ambulatory Visit: Admitting: Family Medicine

## 2024-05-29 DIAGNOSIS — M533 Sacrococcygeal disorders, not elsewhere classified: Secondary | ICD-10-CM | POA: Diagnosis not present

## 2024-05-29 MED ORDER — PANTOPRAZOLE SODIUM 40 MG PO TBEC: 40.0000 mg | DELAYED_RELEASE_TABLET | Freq: Every day | ORAL | 3 refills | Status: AC

## 2024-05-29 MED ORDER — NALTREXONE HCL 50 MG PO TABS: 25.0000 mg | ORAL_TABLET | Freq: Two times a day (BID) | ORAL | 1 refills | Status: AC

## 2024-05-29 MED ORDER — BUPROPION HCL ER (XL) 300 MG PO TB24: 300.0000 mg | ORAL_TABLET | Freq: Every day | ORAL | 1 refills | Status: AC

## 2024-05-29 MED ORDER — MONTELUKAST SODIUM 10 MG PO TABS: 10.0000 mg | ORAL_TABLET | Freq: Every day | ORAL | 3 refills | Status: AC

## 2024-05-29 MED ORDER — BUPROPION HCL ER (XL) 300 MG PO TB24: 300.0000 mg | ORAL_TABLET | Freq: Every day | ORAL | 1 refills | Status: DC

## 2024-05-29 MED ORDER — NALTREXONE HCL 50 MG PO TABS: 25.0000 mg | ORAL_TABLET | Freq: Two times a day (BID) | ORAL | 1 refills | Status: DC

## 2024-05-29 MED ORDER — MONTELUKAST SODIUM 10 MG PO TABS: 10.0000 mg | ORAL_TABLET | Freq: Every day | ORAL | 3 refills | Status: DC

## 2024-05-29 MED ORDER — TRAZODONE HCL 100 MG PO TABS: 300.0000 mg | ORAL_TABLET | Freq: Every evening | ORAL | 1 refills | Status: DC | PRN

## 2024-05-29 MED ORDER — PANTOPRAZOLE SODIUM 40 MG PO TBEC: 40.0000 mg | DELAYED_RELEASE_TABLET | Freq: Every day | ORAL | 3 refills | Status: DC

## 2024-05-29 MED ORDER — TRAZODONE HCL 100 MG PO TABS: 300.0000 mg | ORAL_TABLET | Freq: Every evening | ORAL | 1 refills | Status: AC | PRN

## 2024-05-29 NOTE — Progress Notes (Signed)
 "   MyChart Video Visit    Virtual Visit via Video Note   This format is felt to be most appropriate for this patient at this time. Physical exam was limited by quality of the video and audio technology used for the visit.    Patient location: home Provider location: [facility] Persons involved in the visit: patient, provider  I discussed the limitations of evaluation and management by telemedicine and the availability of in person appointments. The patient expressed understanding and agreed to proceed.  Patient: Dawn Mejia   DOB: 1981-02-14   44 y.o. Female  MRN: [id number] Visit Date: 05/29/2024  Today's healthcare provider: Jon Eva, MD   No chief complaint on file.  Subjective    HPI   Discussed the use of AI scribe software for clinical note transcription with the patient, who gave verbal consent to proceed.  History of Present Illness   Dawn Mejia is a 44 year old female who presents with severe low back pain.  She has had intermittent severe low back pain since the summer and fall, now occurring a few times per week. Episodes are intense enough to stop her activities and may cause her knees to buckle so she must hold onto objects for support.  The pain is in the very low back above the buttocks, near the sacrum and hip bones, sometimes more on the left but sometimes across the entire lower back. Each episode lasts 2 to 3 minutes during which she is unable to move, followed by residual tenderness and soreness.  She recalls being told years ago she had thinning discs in the lower back. Current pain does not radiate down her legs and feels different from prior nerve pain.  She is currently taking bupropion  and naltrexone .         Review of Systems      Objective    There were no vitals taken for this visit.      Physical Exam Constitutional:      General: She is not in acute distress.    Appearance: Normal appearance.   HENT:     Head: Normocephalic.  Pulmonary:     Effort: Pulmonary effort is normal. No respiratory distress.  Neurological:     Mental Status: She is alert and oriented to person, place, and time. Mental status is at baseline.        Assessment & Plan     Problem List Items Addressed This Visit   None Visit Diagnoses       SI (sacroiliac) joint dysfunction    -  Primary   Relevant Orders   Ambulatory referral to Physical Therapy           Sacroiliac joint dysfunction Intermittent low back pain, primarily on the left side, with episodes of severe pain causing immobility. Pain is located in the sacroiliac joint area, described as a dull ache post-episode. Symptoms have increased in frequency and severity over the summer and fall. Differential diagnosis includes SI joint dysfunction, supported by the nature of the pain and its location. Previous imaging (from chiropractor per report) showed thinning discs, but current symptoms do not suggest a slipped disc. - Referred to physical therapy at [facility] on [address] to focus on strengthening around the SI joints. - Advised use of ibuprofen  or Aleve for post-episode achiness. - If no improvement after several weeks of physical therapy, will consider referral to sports medicine for further evaluation and possible injection  therapy.     REfilled chronic meds as well   Meds ordered this encounter  Medications   DISCONTD: buPROPion  (WELLBUTRIN  XL) 300 MG 24 hr tablet    Sig: Take 1 tablet (300 mg total) by mouth daily.    Dispense:  90 tablet    Refill:  1   DISCONTD: montelukast  (SINGULAIR ) 10 MG tablet    Sig: Take 1 tablet (10 mg total) by mouth at bedtime.    Dispense:  90 tablet    Refill:  3   DISCONTD: naltrexone  (DEPADE) 50 MG tablet    Sig: Take 0.5 tablets (25 mg total) by mouth 2 (two) times daily.    Dispense:  90 tablet    Refill:  1   DISCONTD: pantoprazole  (PROTONIX ) 40 MG tablet    Sig: Take 1  tablet (40 mg total) by mouth daily.    Dispense:  90 tablet    Refill:  3   DISCONTD: traZODone  (DESYREL ) 100 MG tablet    Sig: Take 3 tablets (300 mg total) by mouth at bedtime as needed for sleep.    Dispense:  270 tablet    Refill:  1   buPROPion  (WELLBUTRIN  XL) 300 MG 24 hr tablet    Sig: Take 1 tablet (300 mg total) by mouth daily.    Dispense:  90 tablet    Refill:  1   montelukast  (SINGULAIR ) 10 MG tablet    Sig: Take 1 tablet (10 mg total) by mouth at bedtime.    Dispense:  90 tablet    Refill:  3   naltrexone  (DEPADE) 50 MG tablet    Sig: Take 0.5 tablets (25 mg total) by mouth 2 (two) times daily.    Dispense:  90 tablet    Refill:  1   pantoprazole  (PROTONIX ) 40 MG tablet    Sig: Take 1 tablet (40 mg total) by mouth daily.    Dispense:  90 tablet    Refill:  3   traZODone  (DESYREL ) 100 MG tablet    Sig: Take 3 tablets (300 mg total) by mouth at bedtime as needed for sleep.    Dispense:  270 tablet    Refill:  1     Return if symptoms worsen or fail to improve.     I discussed the assessment and treatment plan with the patient. The patient was provided an opportunity to ask questions and all were answered. The patient agreed with the plan and demonstrated an understanding of the instructions.   The patient was advised to call back or seek an in-person evaluation if the symptoms worsen or if the condition fails to improve as anticipated.    Jon Eva, MD [facility] [phone] (phone) [phone] (fax)  [facility]   "

## 2024-06-25 ENCOUNTER — Ambulatory Visit: Admitting: Family Medicine
